# Patient Record
Sex: Female | Born: 1950
Health system: Southern US, Community
[De-identification: ages and names within clinical notes are randomized; demographics above are authoritative.]

## PROBLEM LIST (undated history)

## (undated) DIAGNOSIS — K219 Gastro-esophageal reflux disease without esophagitis: Secondary | ICD-10-CM

## (undated) DIAGNOSIS — F329 Major depressive disorder, single episode, unspecified: Secondary | ICD-10-CM

## (undated) DIAGNOSIS — D219 Benign neoplasm of connective and other soft tissue, unspecified: Secondary | ICD-10-CM

## (undated) DIAGNOSIS — I712 Thoracic aortic aneurysm, without rupture, unspecified: Secondary | ICD-10-CM

## (undated) DIAGNOSIS — Z8719 Personal history of other diseases of the digestive system: Secondary | ICD-10-CM

## (undated) DIAGNOSIS — K648 Other hemorrhoids: Secondary | ICD-10-CM

## (undated) DIAGNOSIS — D649 Anemia, unspecified: Secondary | ICD-10-CM

## (undated) DIAGNOSIS — S42309A Unspecified fracture of shaft of humerus, unspecified arm, initial encounter for closed fracture: Secondary | ICD-10-CM

## (undated) DIAGNOSIS — M797 Fibromyalgia: Secondary | ICD-10-CM

## (undated) DIAGNOSIS — E039 Hypothyroidism, unspecified: Secondary | ICD-10-CM

## (undated) DIAGNOSIS — J309 Allergic rhinitis, unspecified: Secondary | ICD-10-CM

## (undated) DIAGNOSIS — E785 Hyperlipidemia, unspecified: Secondary | ICD-10-CM

## (undated) DIAGNOSIS — N6039 Fibrosclerosis of unspecified breast: Secondary | ICD-10-CM

## (undated) DIAGNOSIS — M8580 Other specified disorders of bone density and structure, unspecified site: Secondary | ICD-10-CM

## (undated) DIAGNOSIS — E790 Hyperuricemia without signs of inflammatory arthritis and tophaceous disease: Secondary | ICD-10-CM

## (undated) DIAGNOSIS — R569 Unspecified convulsions: Secondary | ICD-10-CM

## (undated) DIAGNOSIS — G40409 Other generalized epilepsy and epileptic syndromes, not intractable, without status epilepticus: Secondary | ICD-10-CM

## (undated) DIAGNOSIS — Z87442 Personal history of urinary calculi: Secondary | ICD-10-CM

## (undated) DIAGNOSIS — Z8639 Personal history of other endocrine, nutritional and metabolic disease: Secondary | ICD-10-CM

## (undated) DIAGNOSIS — R7302 Impaired glucose tolerance (oral): Secondary | ICD-10-CM

## (undated) DIAGNOSIS — M1990 Unspecified osteoarthritis, unspecified site: Secondary | ICD-10-CM

## (undated) DIAGNOSIS — N390 Urinary tract infection, site not specified: Secondary | ICD-10-CM

## (undated) HISTORY — DX: Benign neoplasm of connective and other soft tissue, unspecified: D21.9

## (undated) HISTORY — DX: Unspecified fracture of shaft of humerus, unspecified arm, initial encounter for closed fracture: S42.309A

## (undated) HISTORY — DX: Other generalized epilepsy and epileptic syndromes, not intractable, without status epilepticus: G40.409

## (undated) HISTORY — DX: Allergic rhinitis, unspecified: J30.9

## (undated) HISTORY — DX: Major depressive disorder, single episode, unspecified: F32.9

## (undated) HISTORY — DX: Fibromyalgia: M79.7

## (undated) HISTORY — DX: Unspecified convulsions: R56.9

## (undated) HISTORY — DX: Hyperlipidemia, unspecified: E78.5

## (undated) HISTORY — DX: Personal history of other endocrine, nutritional and metabolic disease: Z86.39

## (undated) HISTORY — DX: Hyperuricemia without signs of inflammatory arthritis and tophaceous disease: E79.0

## (undated) HISTORY — DX: Fibrosclerosis of unspecified breast: N60.39

## (undated) HISTORY — DX: Urinary tract infection, site not specified: N39.0

## (undated) HISTORY — DX: Hypothyroidism, unspecified: E03.9

## (undated) HISTORY — PX: CYSTOSCOPY: SUR368

## (undated) HISTORY — DX: Impaired glucose tolerance (oral): R73.02

## (undated) HISTORY — DX: Other specified disorders of bone density and structure, unspecified site: M85.80

## (undated) HISTORY — DX: Other hemorrhoids: K64.8

## (undated) HISTORY — PX: URETEROSCOPY: SHX842

## (undated) HISTORY — DX: Anemia, unspecified: D64.9

## (undated) HISTORY — PX: OTHER SURGICAL HISTORY: SHX169

---
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
## 1985-01-04 HISTORY — PX: OTHER SURGICAL HISTORY: SHX169

## 1987-01-05 HISTORY — PX: ABDOMINAL HYSTERECTOMY: SHX81

## 1988-01-05 DIAGNOSIS — E039 Hypothyroidism, unspecified: Secondary | ICD-10-CM

## 1988-01-05 DIAGNOSIS — M797 Fibromyalgia: Secondary | ICD-10-CM

## 1988-01-05 HISTORY — DX: Fibromyalgia: M79.7

## 1988-01-05 HISTORY — DX: Hypothyroidism, unspecified: E03.9

## 1989-01-04 DIAGNOSIS — F32A Depression, unspecified: Secondary | ICD-10-CM

## 1989-01-04 HISTORY — DX: Depression, unspecified: F32.A

## 2005-10-12 DIAGNOSIS — K648 Other hemorrhoids: Secondary | ICD-10-CM

## 2005-10-12 HISTORY — DX: Other hemorrhoids: K64.8

## 2010-01-04 DIAGNOSIS — M8580 Other specified disorders of bone density and structure, unspecified site: Secondary | ICD-10-CM

## 2010-01-04 HISTORY — DX: Other specified disorders of bone density and structure, unspecified site: M85.80

## 2011-01-18 DIAGNOSIS — N390 Urinary tract infection, site not specified: Secondary | ICD-10-CM | POA: Diagnosis not present

## 2011-03-16 DIAGNOSIS — D1801 Hemangioma of skin and subcutaneous tissue: Secondary | ICD-10-CM | POA: Diagnosis not present

## 2011-08-24 DIAGNOSIS — G9332 Myalgic encephalomyelitis/chronic fatigue syndrome: Secondary | ICD-10-CM | POA: Diagnosis not present

## 2011-08-24 DIAGNOSIS — E039 Hypothyroidism, unspecified: Secondary | ICD-10-CM | POA: Diagnosis not present

## 2011-08-24 DIAGNOSIS — N390 Urinary tract infection, site not specified: Secondary | ICD-10-CM | POA: Diagnosis not present

## 2011-08-24 DIAGNOSIS — R5382 Chronic fatigue, unspecified: Secondary | ICD-10-CM | POA: Diagnosis not present

## 2011-09-07 DIAGNOSIS — Z1231 Encounter for screening mammogram for malignant neoplasm of breast: Secondary | ICD-10-CM | POA: Diagnosis not present

## 2011-11-24 DIAGNOSIS — R7309 Other abnormal glucose: Secondary | ICD-10-CM | POA: Diagnosis not present

## 2011-11-24 DIAGNOSIS — E039 Hypothyroidism, unspecified: Secondary | ICD-10-CM | POA: Diagnosis not present

## 2011-11-24 DIAGNOSIS — R7989 Other specified abnormal findings of blood chemistry: Secondary | ICD-10-CM | POA: Diagnosis not present

## 2011-11-24 DIAGNOSIS — E785 Hyperlipidemia, unspecified: Secondary | ICD-10-CM | POA: Diagnosis not present

## 2011-11-24 DIAGNOSIS — R5382 Chronic fatigue, unspecified: Secondary | ICD-10-CM | POA: Diagnosis not present

## 2011-11-24 DIAGNOSIS — N390 Urinary tract infection, site not specified: Secondary | ICD-10-CM | POA: Diagnosis not present

## 2011-12-10 DIAGNOSIS — E039 Hypothyroidism, unspecified: Secondary | ICD-10-CM | POA: Diagnosis not present

## 2011-12-27 DIAGNOSIS — H43399 Other vitreous opacities, unspecified eye: Secondary | ICD-10-CM | POA: Diagnosis not present

## 2011-12-27 DIAGNOSIS — H2589 Other age-related cataract: Secondary | ICD-10-CM | POA: Diagnosis not present

## 2011-12-27 DIAGNOSIS — H40019 Open angle with borderline findings, low risk, unspecified eye: Secondary | ICD-10-CM | POA: Diagnosis not present

## 2012-03-21 DIAGNOSIS — N898 Other specified noninflammatory disorders of vagina: Secondary | ICD-10-CM | POA: Diagnosis not present

## 2012-03-21 DIAGNOSIS — R300 Dysuria: Secondary | ICD-10-CM | POA: Diagnosis not present

## 2012-08-03 DIAGNOSIS — J069 Acute upper respiratory infection, unspecified: Secondary | ICD-10-CM | POA: Diagnosis not present

## 2012-08-03 DIAGNOSIS — R5383 Other fatigue: Secondary | ICD-10-CM | POA: Diagnosis not present

## 2012-08-03 DIAGNOSIS — L578 Other skin changes due to chronic exposure to nonionizing radiation: Secondary | ICD-10-CM | POA: Diagnosis not present

## 2012-08-03 DIAGNOSIS — R5381 Other malaise: Secondary | ICD-10-CM | POA: Diagnosis not present

## 2012-08-03 DIAGNOSIS — N390 Urinary tract infection, site not specified: Secondary | ICD-10-CM | POA: Diagnosis not present

## 2012-08-21 DIAGNOSIS — R5382 Chronic fatigue, unspecified: Secondary | ICD-10-CM | POA: Diagnosis not present

## 2012-08-21 DIAGNOSIS — R7989 Other specified abnormal findings of blood chemistry: Secondary | ICD-10-CM | POA: Diagnosis not present

## 2012-08-21 DIAGNOSIS — E039 Hypothyroidism, unspecified: Secondary | ICD-10-CM | POA: Diagnosis not present

## 2012-09-05 DIAGNOSIS — J309 Allergic rhinitis, unspecified: Secondary | ICD-10-CM | POA: Diagnosis not present

## 2012-09-05 DIAGNOSIS — Z1331 Encounter for screening for depression: Secondary | ICD-10-CM | POA: Diagnosis not present

## 2012-09-05 DIAGNOSIS — M899 Disorder of bone, unspecified: Secondary | ICD-10-CM | POA: Diagnosis not present

## 2012-09-05 DIAGNOSIS — E039 Hypothyroidism, unspecified: Secondary | ICD-10-CM | POA: Diagnosis not present

## 2012-09-05 DIAGNOSIS — R5382 Chronic fatigue, unspecified: Secondary | ICD-10-CM | POA: Diagnosis not present

## 2012-09-05 DIAGNOSIS — IMO0002 Reserved for concepts with insufficient information to code with codable children: Secondary | ICD-10-CM | POA: Diagnosis not present

## 2012-09-05 DIAGNOSIS — R569 Unspecified convulsions: Secondary | ICD-10-CM | POA: Diagnosis not present

## 2012-11-15 DIAGNOSIS — R6882 Decreased libido: Secondary | ICD-10-CM | POA: Diagnosis not present

## 2012-12-13 DIAGNOSIS — Z1231 Encounter for screening mammogram for malignant neoplasm of breast: Secondary | ICD-10-CM | POA: Diagnosis not present

## 2012-12-20 DIAGNOSIS — H2589 Other age-related cataract: Secondary | ICD-10-CM | POA: Diagnosis not present

## 2012-12-20 DIAGNOSIS — H40009 Preglaucoma, unspecified, unspecified eye: Secondary | ICD-10-CM | POA: Diagnosis not present

## 2013-02-07 DIAGNOSIS — R6882 Decreased libido: Secondary | ICD-10-CM | POA: Diagnosis not present

## 2013-04-12 DIAGNOSIS — J069 Acute upper respiratory infection, unspecified: Secondary | ICD-10-CM | POA: Diagnosis not present

## 2013-04-12 DIAGNOSIS — R6882 Decreased libido: Secondary | ICD-10-CM | POA: Diagnosis not present

## 2013-06-20 DIAGNOSIS — E785 Hyperlipidemia, unspecified: Secondary | ICD-10-CM | POA: Diagnosis not present

## 2013-06-20 DIAGNOSIS — M949 Disorder of cartilage, unspecified: Secondary | ICD-10-CM | POA: Diagnosis not present

## 2013-06-20 DIAGNOSIS — E039 Hypothyroidism, unspecified: Secondary | ICD-10-CM | POA: Diagnosis not present

## 2013-06-20 DIAGNOSIS — M899 Disorder of bone, unspecified: Secondary | ICD-10-CM | POA: Diagnosis not present

## 2013-06-20 DIAGNOSIS — R82998 Other abnormal findings in urine: Secondary | ICD-10-CM | POA: Diagnosis not present

## 2013-06-20 DIAGNOSIS — R809 Proteinuria, unspecified: Secondary | ICD-10-CM | POA: Diagnosis not present

## 2013-06-26 DIAGNOSIS — Z1212 Encounter for screening for malignant neoplasm of rectum: Secondary | ICD-10-CM | POA: Diagnosis not present

## 2013-06-27 DIAGNOSIS — E785 Hyperlipidemia, unspecified: Secondary | ICD-10-CM | POA: Diagnosis not present

## 2013-06-27 DIAGNOSIS — R002 Palpitations: Secondary | ICD-10-CM | POA: Diagnosis not present

## 2013-06-27 DIAGNOSIS — Z0000 Encounter for general adult medical examination without abnormal findings: Secondary | ICD-10-CM | POA: Diagnosis not present

## 2013-06-27 DIAGNOSIS — R5382 Chronic fatigue, unspecified: Secondary | ICD-10-CM | POA: Diagnosis not present

## 2013-06-27 DIAGNOSIS — R7989 Other specified abnormal findings of blood chemistry: Secondary | ICD-10-CM | POA: Diagnosis not present

## 2013-06-27 DIAGNOSIS — G9332 Myalgic encephalomyelitis/chronic fatigue syndrome: Secondary | ICD-10-CM | POA: Diagnosis not present

## 2013-06-27 DIAGNOSIS — E039 Hypothyroidism, unspecified: Secondary | ICD-10-CM | POA: Diagnosis not present

## 2013-06-27 DIAGNOSIS — L259 Unspecified contact dermatitis, unspecified cause: Secondary | ICD-10-CM | POA: Diagnosis not present

## 2013-06-27 DIAGNOSIS — M899 Disorder of bone, unspecified: Secondary | ICD-10-CM | POA: Diagnosis not present

## 2013-06-27 DIAGNOSIS — R569 Unspecified convulsions: Secondary | ICD-10-CM | POA: Diagnosis not present

## 2013-06-27 DIAGNOSIS — M949 Disorder of cartilage, unspecified: Secondary | ICD-10-CM | POA: Diagnosis not present

## 2013-08-31 DIAGNOSIS — Z6826 Body mass index (BMI) 26.0-26.9, adult: Secondary | ICD-10-CM | POA: Diagnosis not present

## 2013-08-31 DIAGNOSIS — R197 Diarrhea, unspecified: Secondary | ICD-10-CM | POA: Diagnosis not present

## 2013-08-31 DIAGNOSIS — E039 Hypothyroidism, unspecified: Secondary | ICD-10-CM | POA: Diagnosis not present

## 2013-08-31 DIAGNOSIS — G9332 Myalgic encephalomyelitis/chronic fatigue syndrome: Secondary | ICD-10-CM | POA: Diagnosis not present

## 2013-08-31 DIAGNOSIS — R110 Nausea: Secondary | ICD-10-CM | POA: Diagnosis not present

## 2013-08-31 DIAGNOSIS — R142 Eructation: Secondary | ICD-10-CM | POA: Diagnosis not present

## 2013-08-31 DIAGNOSIS — R141 Gas pain: Secondary | ICD-10-CM | POA: Diagnosis not present

## 2013-08-31 DIAGNOSIS — R143 Flatulence: Secondary | ICD-10-CM | POA: Diagnosis not present

## 2013-08-31 DIAGNOSIS — R5382 Chronic fatigue, unspecified: Secondary | ICD-10-CM | POA: Diagnosis not present

## 2013-09-05 DIAGNOSIS — R928 Other abnormal and inconclusive findings on diagnostic imaging of breast: Secondary | ICD-10-CM | POA: Diagnosis not present

## 2013-09-05 DIAGNOSIS — Q638 Other specified congenital malformations of kidney: Secondary | ICD-10-CM | POA: Diagnosis not present

## 2013-09-05 DIAGNOSIS — K7689 Other specified diseases of liver: Secondary | ICD-10-CM | POA: Diagnosis not present

## 2013-09-11 ENCOUNTER — Encounter: Payer: Self-pay | Admitting: Nurse Practitioner

## 2013-09-19 ENCOUNTER — Ambulatory Visit: Payer: Self-pay | Admitting: Nurse Practitioner

## 2013-09-19 ENCOUNTER — Encounter: Payer: Self-pay | Admitting: *Deleted

## 2013-09-20 ENCOUNTER — Ambulatory Visit (INDEPENDENT_AMBULATORY_CARE_PROVIDER_SITE_OTHER): Payer: Medicare Other | Admitting: Nurse Practitioner

## 2013-09-20 ENCOUNTER — Other Ambulatory Visit (INDEPENDENT_AMBULATORY_CARE_PROVIDER_SITE_OTHER): Payer: Medicare Other

## 2013-09-20 ENCOUNTER — Encounter: Payer: Self-pay | Admitting: Nurse Practitioner

## 2013-09-20 VITALS — BP 128/82 | HR 72 | Ht 64.5 in | Wt 154.0 lb

## 2013-09-20 DIAGNOSIS — R140 Abdominal distension (gaseous): Secondary | ICD-10-CM

## 2013-09-20 DIAGNOSIS — R142 Eructation: Secondary | ICD-10-CM

## 2013-09-20 DIAGNOSIS — R141 Gas pain: Secondary | ICD-10-CM

## 2013-09-20 DIAGNOSIS — R143 Flatulence: Secondary | ICD-10-CM

## 2013-09-20 DIAGNOSIS — R197 Diarrhea, unspecified: Secondary | ICD-10-CM | POA: Diagnosis not present

## 2013-09-20 LAB — IGA: IgA: 210 mg/dL (ref 68–378)

## 2013-09-20 MED ORDER — RIFAXIMIN 550 MG PO TABS: ORAL_TABLET | ORAL | Status: DC

## 2013-09-20 NOTE — Patient Instructions (Signed)
Please go to the basement level to have your labs drawn.  We called in the Flagyl ( Metronidazole) 250 mg to [facility]   We made you a follow up appointment with Dr. Fuller Plan, for 10-17-2013 at 11 AM.

## 2013-09-20 NOTE — Progress Notes (Addendum)
HPI :  Patient is a 63 year old female, new to this practice, referred by PCP at [facility] for evaluation of nausea, bloating and frequent diarrhea. Her problems began about four months ago. She was tried on IT trainer with limited benefit. She is using Phazyme for gas. CTscan earlier this month revealed mild fecal retention which was surprising to her. Patient hasn't changed any of her home meds. No dietary changes. June labs (I reviewed her online labs on her cell phone)  revealed normal hgb of 14.9, normal LFTs, normal TSH. Uric acid and serum calcium elevated. Her last colonoscopy was in 2007. She was heme negative in June. Weight is up a few pounds.   Patient has been has fibromyalgia and has been on disability since the 1990s for chronic fatigue syndrome. She is under care of Dr. Jeral Fruit in [place]. Patient mentions that GI problems often accompany chronic fatigue syndrome though she never had GI problems until a few months ago.   Past Medical History  Diagnosis Date  . Hemorrhoids, internal [id number]  . Grand mal seizure 1960  . Arm fracture 1957  . Osteopenia 2012    Bone density Test  . Allergic rhinitis     Dust mites, mold, plant polens  . Fibrosis, breast     Mammogram  . Hypothyroidism 1990  . Hyperlipidemia   . Glucose intolerance (impaired glucose tolerance)   . History of hypercalcemia   . Elevated uric acid in blood   . Anemia 1970's  . Depression 1991    6 months  . Fibromyalgia 1990  . Seizures   . Urinary tract infection     Family History  Problem Relation Age of Onset  . Parkinson's disease Father     Deceased at 57  . Hypertension Mother   . Osteoporosis Mother   . Breast cancer Sister   . Arthritis Brother   . Kidney Stones Brother   . Gout Brother   . Pancreatic cancer Paternal Aunt   . Colon cancer Neg Hx   . Colon polyps Neg Hx    History  Substance Use Topics  . Smoking status: Never Smoker   . Smokeless tobacco:  Never Used  . Alcohol Use: No   Current Outpatient Prescriptions  Medication Sig Dispense Refill  . amphetamine-dextroamphetamine (ADDERALL) 20 MG tablet Take 20 mg by mouth daily. 10 mg in morning and 10 mg at lunchtime      . clonazePAM (KLONOPIN) 1 MG tablet Take 1 mg by mouth daily. .75 mg at pm; .25 mg in am      . estradiol (ESTRACE) 0.5 MG tablet Take 0.5 mg by mouth daily.      Marland Kitchen levothyroxine (SYNTHROID, LEVOTHROID) 100 MCG tablet Take 100 mcg by mouth daily before breakfast. Take 1 tab daily for 6 days and no tab on Sundays.       No current facility-administered medications for this visit.   Allergies  Allergen Reactions  . Ciprofloxacin     Pt gets dehydrated  . Epinephrine     Makes pt feel like they are coming out of their skin     Review of Systems: Positive for fatigue, muscle pain, shortness of breath and swollen lymph glands. All other systems reviewed and negative except where noted in HPI.   Physical Exam: BP 128/82  Pulse 72  Ht 5' 4.5" (1.638 m)  Wt 154 lb (69.854 kg)  BMI 26.04 kg/m2 Constitutional: Tired looking white female  in no acute distress. HEENT: Normocephalic and atraumatic. Conjunctivae are normal. No scleral icterus. Neck supple.  Cardiovascular: Normal rate, regular rhythm.  Pulmonary/chest: Effort normal and breath sounds normal. No wheezing, rales or rhonchi. Abdominal: Soft, nondistended, nontender. Bowel sounds active throughout. There are no masses palpable. No hepatomegaly. Extremities: no edema Lymphadenopathy: No cervical adenopathy noted. Neurological: Alert and oriented to person place and time. Skin: Skin is warm and dry. No rashes noted. Psychiatric: slightly agitated. Flat affect.   ASSESSMENT AND PLAN: 37. 63 year old female with a several month history of bloating, excessive belching, occasional loose stools and generalized abdominal pain not necessarily related to eating. Adderall can cause loose stools but she has been on  this medication for a long time. Symptoms sound functional but will obtain labs for celiac disease. Trial of Flagyl for possible small bowel bacterial overgrowth. If no improvement consider workup for biliary dyskinesia. Stop PPI, it hasn't been beneficial and she has to pay out of pocket for all her medications.   2. Chronic fatigue syndrome and fibromyalgia. She has been on disability since the 1990's for chronic fatigue syndrome. She is treated with Adderall and followed by Dr. Jeral Fruit in [facility]  Addendum 10/01/13 Colonoscopy report received from [facility]. Colonoscopy was done for screening purposes. Extent of exam to the cecum. Prep was adequate. Findings included small internal hemorrhoids. Recommended repeat colonoscopy in 10 years.

## 2013-09-21 ENCOUNTER — Encounter: Payer: Self-pay | Admitting: Nurse Practitioner

## 2013-09-21 DIAGNOSIS — R140 Abdominal distension (gaseous): Secondary | ICD-10-CM | POA: Insufficient documentation

## 2013-09-21 DIAGNOSIS — R197 Diarrhea, unspecified: Secondary | ICD-10-CM | POA: Insufficient documentation

## 2013-09-23 ENCOUNTER — Encounter: Payer: Self-pay | Admitting: Nurse Practitioner

## 2013-09-23 LAB — T-TRANSGLUTAMINASE (TTG) IGG: Tissue Transglut Ab: 3 U/mL (ref 0–5)

## 2013-09-23 NOTE — Progress Notes (Signed)
Reviewed and agree with management plan.  Tannen Vandezande T. Judith Demps, MD FACG 

## 2013-09-27 ENCOUNTER — Telehealth: Payer: Self-pay | Admitting: *Deleted

## 2013-09-27 NOTE — Telephone Encounter (Signed)
Message copied by Hulan Saas on Thu Sep 27, 2013  3:45 PM ------      Message from: Willia Craze      Created: Thu Sep 27, 2013 11:47 AM       Rollene Fare, patient had sent an email asking if flagyl may have caused a sore throat. I accidentally closed out her email without answering it. Please tell her it is probably unrelated unless she has gotten yeast infection from antibiotics (ask her to look at for white film on tongue). Glad flagyl is helping her GI symptoms      Thanks ------

## 2013-09-27 NOTE — Telephone Encounter (Signed)
Spoke with patient and she is better now. She had a cold.

## 2013-10-17 ENCOUNTER — Encounter: Payer: Self-pay | Admitting: Gastroenterology

## 2013-10-17 ENCOUNTER — Ambulatory Visit (INDEPENDENT_AMBULATORY_CARE_PROVIDER_SITE_OTHER): Payer: Medicare Other | Admitting: Gastroenterology

## 2013-10-17 VITALS — BP 136/82 | HR 76 | Ht 64.5 in | Wt 153.0 lb

## 2013-10-17 DIAGNOSIS — R1084 Generalized abdominal pain: Secondary | ICD-10-CM

## 2013-10-17 DIAGNOSIS — R140 Abdominal distension (gaseous): Secondary | ICD-10-CM | POA: Diagnosis not present

## 2013-10-17 DIAGNOSIS — K219 Gastro-esophageal reflux disease without esophagitis: Secondary | ICD-10-CM | POA: Diagnosis not present

## 2013-10-17 DIAGNOSIS — R197 Diarrhea, unspecified: Secondary | ICD-10-CM

## 2013-10-17 MED ORDER — METRONIDAZOLE 500 MG PO TABS: 500.0000 mg | ORAL_TABLET | Freq: Two times a day (BID) | ORAL | Status: DC

## 2013-10-17 NOTE — Progress Notes (Signed)
    History of Present Illness: This is a 63 year old female with chronic fatigue syndrome who has multiple gastrointestinal complaints. She was evaluated by Jean Scott in mid-September. She notes postprandial bloating and generalized abdominal discomfort following meals for about the past 6 months. She relates reflux symptoms and regurgitation, frequently. These symptoms were partially controlled on omeprazole and have worsened since she discontinued omeprazole. She has loose bowel movements frequently and her symptoms worsen when she eats out. She notes the symptoms improved while taking a course of metronidazole however her symptoms have returned to her baseline after finishing metronidazole. She underwent a CT scan of the abdomen/pelvis on 09/05/2013 showing a slight increase in fecal burden, fatty liver and a right breast cyst. She underwent colonoscopy in 2007 at outside facility that was apparently normal. A celiac disease antibody testing was negative. She notes a slight weight gain over the past several months. Denies weight loss, constipation, change in stool caliber, melena, hematochezia, nausea, vomiting, dysphagia, chest pain.  Current Medications, Allergies, Past Medical History, Past Surgical History, Family History and Social History were reviewed in [employer] record.  Physical Exam: General: Well developed , well nourished, no acute distress Head: Normocephalic and atraumatic Eyes:  sclerae anicteric, EOMI Ears: Normal auditory acuity Mouth: No deformity or lesions Lungs: Clear throughout to auscultation Heart: Regular rate and rhythm; no murmurs, rubs or bruits Abdomen: Soft, non tender and non distended. No masses, hepatosplenomegaly or hernias noted. Normal Bowel sounds Musculoskeletal: Symmetrical with no gross deformities  Pulses:  Normal pulses noted Extremities: No clubbing, cyanosis, edema or deformities noted Neurological: Alert oriented x 4,  grossly nonfocal Psychological:  Alert and cooperative. Normal mood and affect  Assessment and Recommendations:  1. GERD, diarrhea, belching, postprandial abdominal bloating and abdominal pain. Suspect that she has GERD and IBS. She states she does not have prescription coverage and she's very concerned about the cost of medications which will limit our options. Omeprazole 20 mg twice daily. Metronidazole 500 mg twice daily for 14 days for treatment of possible SIBO and possible IBS-D. Begin a daily probiotic. All standard antireflux measures. 7 day trial of a lactose free diet. Begin a low gas diet and Gas-X 4 times a day as needed. If symptoms persist will obtain a GI pathogen panel, stool Hemoccults, consider FODMAP diet, consider an anti-spasmodic, consider GES and consider colonoscopy and upper endoscopy. Return office visit in one month.

## 2013-10-17 NOTE — Patient Instructions (Addendum)
You have been given a low gas diet, and anti reflux measures to follow. Please remain lactose free for 1 week. Try Gas-X four times daily as needed. Align once daily Omeprazole 20 mg over the counter twice daily. We have sent medications to your pharmacy for you to pick up at your convenience. Follow up on 11/20/13 at 9 am with Dr Fuller Plan.  CC: Prince Solian, MD

## 2013-10-24 ENCOUNTER — Encounter: Payer: Self-pay | Admitting: Gastroenterology

## 2013-11-20 ENCOUNTER — Encounter: Payer: Self-pay | Admitting: Gastroenterology

## 2013-11-20 ENCOUNTER — Ambulatory Visit (INDEPENDENT_AMBULATORY_CARE_PROVIDER_SITE_OTHER): Payer: Medicare Other | Admitting: Gastroenterology

## 2013-11-20 VITALS — BP 130/90 | HR 68 | Ht 64.5 in | Wt 152.8 lb

## 2013-11-20 DIAGNOSIS — K219 Gastro-esophageal reflux disease without esophagitis: Secondary | ICD-10-CM

## 2013-11-20 DIAGNOSIS — E739 Lactose intolerance, unspecified: Secondary | ICD-10-CM | POA: Insufficient documentation

## 2013-11-20 NOTE — Progress Notes (Signed)
    History of Present Illness: This is a 63 year old female returning for follow-up of GERD, gas, bloating, belching, abdominal pain and diarrhea. Her symptoms all resolved after taking a course of metronidazole for SIBO or ISB-D, following a reduced lactose diet and intensify her treatment for GERD. She has no gastrointestinal complaints today.  Current Medications, Allergies, Past Medical History, Past Surgical History, Family History and Social History were reviewed in [employer] record.  Physical Exam: General: Well developed , well nourished, no acute distress Head: Normocephalic and atraumatic Eyes:  sclerae anicteric, EOMI Ears: Normal auditory acuity Mouth: No deformity or lesions Lungs: Clear throughout to auscultation Heart: Regular rate and rhythm; no murmurs, rubs or bruits Abdomen: Soft, non tender and non distended. No masses, hepatosplenomegaly or hernias noted. Normal Bowel sounds Musculoskeletal: Symmetrical with no gross deformities  Pulses:  Normal pulses noted Extremities: No clubbing, cyanosis, edema or deformities noted Neurological: Alert oriented x 4, grossly nonfocal Psychological:  Alert and cooperative. Normal mood and affect  Assessment and Recommendations:  GERD, SIBO, lactose intolerance and possible IBS. Symptoms resolved after completing a course of metronidazole,following a reduced lactose diet and increasing PPI dosage. Continue omeprazole 20 mg twice daily and a daily probiotic. Continue all standard antireflux measures and a reduced lactose diet. Continue low gas diet and Gas-X 4 times a day as needed. Follow up as needed.

## 2013-11-20 NOTE — Patient Instructions (Signed)
Please follow up with Dr Fuller Plan as needed.  CC: Prince Solian, MD

## 2014-01-01 DIAGNOSIS — H40023 Open angle with borderline findings, high risk, bilateral: Secondary | ICD-10-CM | POA: Diagnosis not present

## 2014-01-01 DIAGNOSIS — H2513 Age-related nuclear cataract, bilateral: Secondary | ICD-10-CM | POA: Diagnosis not present

## 2014-01-09 DIAGNOSIS — Z01419 Encounter for gynecological examination (general) (routine) without abnormal findings: Secondary | ICD-10-CM | POA: Diagnosis not present

## 2014-01-09 DIAGNOSIS — Z1231 Encounter for screening mammogram for malignant neoplasm of breast: Secondary | ICD-10-CM | POA: Diagnosis not present

## 2014-03-05 DIAGNOSIS — H18411 Arcus senilis, right eye: Secondary | ICD-10-CM | POA: Diagnosis not present

## 2014-03-05 DIAGNOSIS — H18412 Arcus senilis, left eye: Secondary | ICD-10-CM | POA: Diagnosis not present

## 2014-03-05 DIAGNOSIS — H2511 Age-related nuclear cataract, right eye: Secondary | ICD-10-CM | POA: Diagnosis not present

## 2014-03-05 DIAGNOSIS — H02839 Dermatochalasis of unspecified eye, unspecified eyelid: Secondary | ICD-10-CM | POA: Diagnosis not present

## 2014-03-22 DIAGNOSIS — R8299 Other abnormal findings in urine: Secondary | ICD-10-CM | POA: Diagnosis not present

## 2014-03-22 DIAGNOSIS — N390 Urinary tract infection, site not specified: Secondary | ICD-10-CM | POA: Diagnosis not present

## 2014-07-03 DIAGNOSIS — N390 Urinary tract infection, site not specified: Secondary | ICD-10-CM | POA: Diagnosis not present

## 2014-07-03 DIAGNOSIS — R7301 Impaired fasting glucose: Secondary | ICD-10-CM | POA: Diagnosis not present

## 2014-07-03 DIAGNOSIS — R8299 Other abnormal findings in urine: Secondary | ICD-10-CM | POA: Diagnosis not present

## 2014-07-03 DIAGNOSIS — E039 Hypothyroidism, unspecified: Secondary | ICD-10-CM | POA: Diagnosis not present

## 2014-07-03 DIAGNOSIS — M859 Disorder of bone density and structure, unspecified: Secondary | ICD-10-CM | POA: Diagnosis not present

## 2014-07-03 DIAGNOSIS — E785 Hyperlipidemia, unspecified: Secondary | ICD-10-CM | POA: Diagnosis not present

## 2014-07-10 DIAGNOSIS — R300 Dysuria: Secondary | ICD-10-CM | POA: Diagnosis not present

## 2014-07-10 DIAGNOSIS — J309 Allergic rhinitis, unspecified: Secondary | ICD-10-CM | POA: Diagnosis not present

## 2014-07-10 DIAGNOSIS — Z6826 Body mass index (BMI) 26.0-26.9, adult: Secondary | ICD-10-CM | POA: Diagnosis not present

## 2014-07-10 DIAGNOSIS — R002 Palpitations: Secondary | ICD-10-CM | POA: Diagnosis not present

## 2014-07-10 DIAGNOSIS — Z23 Encounter for immunization: Secondary | ICD-10-CM | POA: Diagnosis not present

## 2014-07-10 DIAGNOSIS — Z1389 Encounter for screening for other disorder: Secondary | ICD-10-CM | POA: Diagnosis not present

## 2014-07-10 DIAGNOSIS — E785 Hyperlipidemia, unspecified: Secondary | ICD-10-CM | POA: Diagnosis not present

## 2014-07-10 DIAGNOSIS — E790 Hyperuricemia without signs of inflammatory arthritis and tophaceous disease: Secondary | ICD-10-CM | POA: Diagnosis not present

## 2014-07-10 DIAGNOSIS — E039 Hypothyroidism, unspecified: Secondary | ICD-10-CM | POA: Diagnosis not present

## 2014-07-10 DIAGNOSIS — Z1212 Encounter for screening for malignant neoplasm of rectum: Secondary | ICD-10-CM | POA: Diagnosis not present

## 2014-07-10 DIAGNOSIS — L309 Dermatitis, unspecified: Secondary | ICD-10-CM | POA: Diagnosis not present

## 2014-07-10 DIAGNOSIS — Z0000 Encounter for general adult medical examination without abnormal findings: Secondary | ICD-10-CM | POA: Diagnosis not present

## 2014-07-30 DIAGNOSIS — L570 Actinic keratosis: Secondary | ICD-10-CM | POA: Diagnosis not present

## 2014-07-30 DIAGNOSIS — L718 Other rosacea: Secondary | ICD-10-CM | POA: Diagnosis not present

## 2014-07-30 DIAGNOSIS — L738 Other specified follicular disorders: Secondary | ICD-10-CM | POA: Diagnosis not present

## 2014-07-30 DIAGNOSIS — L814 Other melanin hyperpigmentation: Secondary | ICD-10-CM | POA: Diagnosis not present

## 2014-08-13 DIAGNOSIS — H40023 Open angle with borderline findings, high risk, bilateral: Secondary | ICD-10-CM | POA: Diagnosis not present

## 2014-08-13 DIAGNOSIS — H2513 Age-related nuclear cataract, bilateral: Secondary | ICD-10-CM | POA: Diagnosis not present

## 2014-09-03 DIAGNOSIS — H2513 Age-related nuclear cataract, bilateral: Secondary | ICD-10-CM | POA: Diagnosis not present

## 2014-09-03 DIAGNOSIS — H18413 Arcus senilis, bilateral: Secondary | ICD-10-CM | POA: Diagnosis not present

## 2014-09-03 DIAGNOSIS — H02839 Dermatochalasis of unspecified eye, unspecified eyelid: Secondary | ICD-10-CM | POA: Diagnosis not present

## 2014-09-25 DIAGNOSIS — H04123 Dry eye syndrome of bilateral lacrimal glands: Secondary | ICD-10-CM | POA: Diagnosis not present

## 2014-10-24 DIAGNOSIS — H25812 Combined forms of age-related cataract, left eye: Secondary | ICD-10-CM | POA: Diagnosis not present

## 2014-10-24 DIAGNOSIS — H25813 Combined forms of age-related cataract, bilateral: Secondary | ICD-10-CM | POA: Diagnosis not present

## 2014-10-24 DIAGNOSIS — E039 Hypothyroidism, unspecified: Secondary | ICD-10-CM | POA: Diagnosis not present

## 2014-10-24 DIAGNOSIS — H25811 Combined forms of age-related cataract, right eye: Secondary | ICD-10-CM | POA: Diagnosis not present

## 2014-10-24 DIAGNOSIS — E785 Hyperlipidemia, unspecified: Secondary | ICD-10-CM | POA: Diagnosis not present

## 2014-10-24 DIAGNOSIS — Z79899 Other long term (current) drug therapy: Secondary | ICD-10-CM | POA: Diagnosis not present

## 2014-10-24 DIAGNOSIS — M797 Fibromyalgia: Secondary | ICD-10-CM | POA: Diagnosis not present

## 2014-10-31 DIAGNOSIS — H25811 Combined forms of age-related cataract, right eye: Secondary | ICD-10-CM | POA: Diagnosis not present

## 2014-11-12 DIAGNOSIS — Z6826 Body mass index (BMI) 26.0-26.9, adult: Secondary | ICD-10-CM | POA: Diagnosis not present

## 2014-11-12 DIAGNOSIS — R310 Gross hematuria: Secondary | ICD-10-CM | POA: Diagnosis not present

## 2014-11-12 DIAGNOSIS — R8290 Unspecified abnormal findings in urine: Secondary | ICD-10-CM | POA: Diagnosis not present

## 2014-11-12 DIAGNOSIS — R109 Unspecified abdominal pain: Secondary | ICD-10-CM | POA: Diagnosis not present

## 2014-11-12 DIAGNOSIS — N390 Urinary tract infection, site not specified: Secondary | ICD-10-CM | POA: Diagnosis not present

## 2014-11-20 DIAGNOSIS — R310 Gross hematuria: Secondary | ICD-10-CM | POA: Diagnosis not present

## 2014-11-20 DIAGNOSIS — N200 Calculus of kidney: Secondary | ICD-10-CM | POA: Diagnosis not present

## 2014-12-18 DIAGNOSIS — N200 Calculus of kidney: Secondary | ICD-10-CM | POA: Diagnosis not present

## 2014-12-18 DIAGNOSIS — Q631 Lobulated, fused and horseshoe kidney: Secondary | ICD-10-CM | POA: Diagnosis not present

## 2014-12-18 DIAGNOSIS — R338 Other retention of urine: Secondary | ICD-10-CM | POA: Diagnosis not present

## 2014-12-18 DIAGNOSIS — R34 Anuria and oliguria: Secondary | ICD-10-CM | POA: Diagnosis not present

## 2014-12-18 DIAGNOSIS — R39198 Other difficulties with micturition: Secondary | ICD-10-CM | POA: Diagnosis not present

## 2015-01-14 DIAGNOSIS — Q631 Lobulated, fused and horseshoe kidney: Secondary | ICD-10-CM | POA: Diagnosis not present

## 2015-01-14 DIAGNOSIS — M797 Fibromyalgia: Secondary | ICD-10-CM | POA: Diagnosis not present

## 2015-01-14 DIAGNOSIS — G40909 Epilepsy, unspecified, not intractable, without status epilepticus: Secondary | ICD-10-CM | POA: Diagnosis not present

## 2015-01-14 DIAGNOSIS — N202 Calculus of kidney with calculus of ureter: Secondary | ICD-10-CM | POA: Diagnosis not present

## 2015-01-14 DIAGNOSIS — Z79899 Other long term (current) drug therapy: Secondary | ICD-10-CM | POA: Diagnosis not present

## 2015-01-14 DIAGNOSIS — N200 Calculus of kidney: Secondary | ICD-10-CM | POA: Diagnosis not present

## 2015-01-14 DIAGNOSIS — L309 Dermatitis, unspecified: Secondary | ICD-10-CM | POA: Diagnosis not present

## 2015-01-29 DIAGNOSIS — Q631 Lobulated, fused and horseshoe kidney: Secondary | ICD-10-CM | POA: Diagnosis not present

## 2015-01-29 DIAGNOSIS — N200 Calculus of kidney: Secondary | ICD-10-CM | POA: Diagnosis not present

## 2015-01-29 DIAGNOSIS — Z466 Encounter for fitting and adjustment of urinary device: Secondary | ICD-10-CM | POA: Diagnosis not present

## 2015-02-14 DIAGNOSIS — R39198 Other difficulties with micturition: Secondary | ICD-10-CM | POA: Diagnosis not present

## 2015-02-21 DIAGNOSIS — Z01419 Encounter for gynecological examination (general) (routine) without abnormal findings: Secondary | ICD-10-CM | POA: Diagnosis not present

## 2015-02-21 DIAGNOSIS — Z6827 Body mass index (BMI) 27.0-27.9, adult: Secondary | ICD-10-CM | POA: Diagnosis not present

## 2015-02-21 DIAGNOSIS — Z1231 Encounter for screening mammogram for malignant neoplasm of breast: Secondary | ICD-10-CM | POA: Diagnosis not present

## 2015-03-06 DIAGNOSIS — R351 Nocturia: Secondary | ICD-10-CM | POA: Diagnosis not present

## 2015-03-06 DIAGNOSIS — N952 Postmenopausal atrophic vaginitis: Secondary | ICD-10-CM | POA: Diagnosis not present

## 2015-03-06 DIAGNOSIS — Z87442 Personal history of urinary calculi: Secondary | ICD-10-CM | POA: Diagnosis not present

## 2015-03-12 DIAGNOSIS — N202 Calculus of kidney with calculus of ureter: Secondary | ICD-10-CM | POA: Diagnosis not present

## 2015-03-12 DIAGNOSIS — Z9889 Other specified postprocedural states: Secondary | ICD-10-CM | POA: Diagnosis not present

## 2015-03-12 DIAGNOSIS — Q631 Lobulated, fused and horseshoe kidney: Secondary | ICD-10-CM | POA: Diagnosis not present

## 2015-03-12 DIAGNOSIS — N200 Calculus of kidney: Secondary | ICD-10-CM | POA: Diagnosis not present

## 2015-05-15 ENCOUNTER — Other Ambulatory Visit (HOSPITAL_COMMUNITY): Payer: Self-pay | Admitting: Internal Medicine

## 2015-05-15 ENCOUNTER — Ambulatory Visit (HOSPITAL_COMMUNITY)
Admission: RE | Admit: 2015-05-15 | Discharge: 2015-05-15 | Disposition: A | Discharge status: Home / Self Care | Payer: BLUE CROSS/BLUE SHIELD | Source: Ambulatory Visit | Attending: Cardiovascular Disease | Admitting: Cardiovascular Disease

## 2015-05-15 DIAGNOSIS — I071 Rheumatic tricuspid insufficiency: Secondary | ICD-10-CM | POA: Diagnosis not present

## 2015-05-15 DIAGNOSIS — I340 Nonrheumatic mitral (valve) insufficiency: Secondary | ICD-10-CM | POA: Diagnosis not present

## 2015-05-15 DIAGNOSIS — I5189 Other ill-defined heart diseases: Secondary | ICD-10-CM | POA: Diagnosis not present

## 2015-05-15 DIAGNOSIS — I351 Nonrheumatic aortic (valve) insufficiency: Secondary | ICD-10-CM | POA: Insufficient documentation

## 2015-05-15 DIAGNOSIS — I358 Other nonrheumatic aortic valve disorders: Secondary | ICD-10-CM | POA: Diagnosis not present

## 2015-05-15 DIAGNOSIS — R0600 Dyspnea, unspecified: Secondary | ICD-10-CM | POA: Insufficient documentation

## 2015-07-16 DIAGNOSIS — N200 Calculus of kidney: Secondary | ICD-10-CM | POA: Diagnosis not present

## 2015-08-12 DIAGNOSIS — Z961 Presence of intraocular lens: Secondary | ICD-10-CM | POA: Diagnosis not present

## 2015-08-12 DIAGNOSIS — H40023 Open angle with borderline findings, high risk, bilateral: Secondary | ICD-10-CM | POA: Diagnosis not present

## 2015-09-01 ENCOUNTER — Encounter: Payer: Self-pay | Admitting: Gastroenterology

## 2015-09-02 ENCOUNTER — Encounter: Payer: Self-pay | Admitting: Gastroenterology

## 2015-09-03 MED ORDER — CLONAZEPAM 1 MG PO TABS: ORAL_TABLET | ORAL | Status: DC

## 2015-09-03 MED ORDER — SIMETHICONE 125 MG PO CHEW: 125.0000 mg | CHEWABLE_TABLET | Freq: Two times a day (BID) | ORAL | 0 refills | Status: DC

## 2015-09-03 MED ORDER — POLYETHYLENE GLYCOL 3350 17 GM/SCOOP PO POWD: 17.0000 g | Freq: Two times a day (BID) | ORAL | 3 refills | Status: DC

## 2015-09-03 MED ORDER — AMPHETAMINE-DEXTROAMPHETAMINE 20 MG PO TABS: ORAL_TABLET | ORAL | Status: AC

## 2015-09-03 MED ORDER — VITAMIN D3 50 MCG (2000 UT) PO TABS: 2000.0000 [IU] | ORAL_TABLET | Freq: Every day | ORAL | Status: DC

## 2015-09-03 MED ORDER — EMETROL 1.87-1.87-21.5 PO SOLN: 10.0000 mL | Freq: Two times a day (BID) | ORAL | 0 refills | Status: AC

## 2015-09-03 MED ORDER — FLUTICASONE PROPIONATE 50 MCG/ACT NA SUSP: 1.0000 | Freq: Every day | NASAL | 2 refills | Status: DC

## 2015-09-03 NOTE — Addendum Note (Signed)
Addended by: Marzella Schlein on: 09/03/2015 11:35 AM   Modules accepted: Orders

## 2015-09-03 NOTE — Addendum Note (Signed)
Addended by: Marlon Pel L on: 09/03/2015 10:00 AM   Modules accepted: Orders

## 2015-10-02 DIAGNOSIS — Z13820 Encounter for screening for osteoporosis: Secondary | ICD-10-CM | POA: Diagnosis not present

## 2015-10-31 ENCOUNTER — Ambulatory Visit (AMBULATORY_SURGERY_CENTER): Payer: Self-pay

## 2015-10-31 VITALS — Ht 64.0 in | Wt 156.0 lb

## 2015-10-31 DIAGNOSIS — Z1211 Encounter for screening for malignant neoplasm of colon: Secondary | ICD-10-CM

## 2015-10-31 MED ORDER — SUPREP BOWEL PREP KIT 17.5-3.13-1.6 GM/177ML PO SOLN: 1.0000 | Freq: Once | ORAL | 0 refills | Status: AC

## 2015-10-31 NOTE — Progress Notes (Signed)
No allergies to eggs or soy No past problems with anesthesia except slow recovery No diet meds No home oxygen See attached info regarding persons with chronic fatigue syndrome (per pt's request)  Declined emmi Last seizures '89-'90

## 2015-11-08 ENCOUNTER — Encounter: Payer: Self-pay | Admitting: Gastroenterology

## 2015-11-11 ENCOUNTER — Encounter: Payer: Self-pay | Admitting: Gastroenterology

## 2015-11-11 ENCOUNTER — Ambulatory Visit (AMBULATORY_SURGERY_CENTER): Payer: BLUE CROSS/BLUE SHIELD | Admitting: Gastroenterology

## 2015-11-11 VITALS — BP 132/72 | HR 73 | Temp 98.0°F | Resp 20 | Ht 64.0 in | Wt 156.0 lb

## 2015-11-11 DIAGNOSIS — Z1211 Encounter for screening for malignant neoplasm of colon: Secondary | ICD-10-CM | POA: Diagnosis not present

## 2015-11-11 DIAGNOSIS — F419 Anxiety disorder, unspecified: Secondary | ICD-10-CM | POA: Diagnosis not present

## 2015-11-11 DIAGNOSIS — F909 Attention-deficit hyperactivity disorder, unspecified type: Secondary | ICD-10-CM | POA: Diagnosis not present

## 2015-11-11 DIAGNOSIS — E669 Obesity, unspecified: Secondary | ICD-10-CM | POA: Diagnosis not present

## 2015-11-11 DIAGNOSIS — Z1212 Encounter for screening for malignant neoplasm of rectum: Secondary | ICD-10-CM

## 2015-11-11 MED ORDER — SODIUM CHLORIDE 0.9 % IV SOLN: 500.0000 mL | INTRAVENOUS | Status: DC

## 2015-11-11 NOTE — Op Note (Signed)
[facility] Patient Name: Jean Scott Procedure Date: 11/11/2015 10:34 AM MRN: [id number] Endoscopist: Ladene Artist , MD Age: 65 Referring MD:  Date of Birth: 03-04-1950 Gender: Female Account #: [id number] Procedure:                Colonoscopy Indications:              Screening for colorectal malignant neoplasm Medicines:                Monitored Anesthesia Care Procedure:                Pre-Anesthesia Assessment:                           - Prior to the procedure, a History and Physical                            was performed, and patient medications and                            allergies were reviewed. The patient's tolerance of                            previous anesthesia was also reviewed. The risks                            and benefits of the procedure and the sedation                            options and risks were discussed with the patient.                            All questions were answered, and informed consent                            was obtained. Prior Anticoagulants: The patient has                            taken no previous anticoagulant or antiplatelet                            agents. ASA Grade Assessment: II - A patient with                            mild systemic disease. After reviewing the risks                            and benefits, the patient was deemed in                            satisfactory condition to undergo the procedure.                           After obtaining informed consent, the colonoscope  was passed under direct vision. Throughout the                            procedure, the patient's blood pressure, pulse, and                            oxygen saturations were monitored continuously. The                            Model PCF-H190L [id number]) scope was introduced                            through the anus and advanced to the the cecum,                            identified by  appendiceal orifice and ileocecal                            valve. The ileocecal valve, appendiceal orifice,                            and rectum were photographed. The quality of the                            bowel preparation was excellent. The colonoscopy                            was performed without difficulty. The patient                            tolerated the procedure well. Scope In: 10:51:40 AM Scope Out: 11:05:38 AM Scope Withdrawal Time: 0 hours 11 minutes 41 seconds  Total Procedure Duration: 0 hours 13 minutes 58 seconds  Findings:                 The perianal and digital rectal examinations were                            normal.                           The entire examined colon appeared normal on direct                            and retroflexion views. Complications:            No immediate complications. Estimated blood loss:                            None. Estimated Blood Loss:     Estimated blood loss: none. Impression:               - The entire examined colon is normal on direct and                            retroflexion views.                           -  No specimens collected. Recommendation:           - Repeat colonoscopy in 10 years for screening                            purposes.                           - Patient has a contact number available for                            emergencies. The signs and symptoms of potential                            delayed complications were discussed with the                            patient. Return to normal activities tomorrow.                            Written discharge instructions were provided to the                            patient.                           - Resume previous diet.                           - Continue present medications. Ladene Artist, MD 11/11/2015 11:07:58 AM This report has been signed electronically.

## 2015-11-11 NOTE — Progress Notes (Signed)
Report to PACU, RN, vss, BBS= Clear.  

## 2015-11-11 NOTE — Patient Instructions (Signed)
YOU HAD AN ENDOSCOPIC PROCEDURE TODAY AT THE [facility]:   Refer to the procedure report that was given to you for any specific questions about what was found during the examination.  If the procedure report does not answer your questions, please call your gastroenterologist to clarify.  If you requested that your care partner not be given the details of your procedure findings, then the procedure report has been included in a sealed envelope for you to review at your convenience later.  YOU SHOULD EXPECT: Some feelings of bloating in the abdomen. Passage of more gas than usual.  Walking can help get rid of the air that was put into your GI tract during the procedure and reduce the bloating. If you had a lower endoscopy (such as a colonoscopy or flexible sigmoidoscopy) you may notice spotting of blood in your stool or on the toilet paper. If you underwent a bowel prep for your procedure, you may not have a normal bowel movement for a few days.  Please Note:  You might notice some irritation and congestion in your nose or some drainage.  This is from the oxygen used during your procedure.  There is no need for concern and it should clear up in a day or so.  SYMPTOMS TO REPORT IMMEDIATELY:   Following lower endoscopy (colonoscopy or flexible sigmoidoscopy):  Excessive amounts of blood in the stool  Significant tenderness or worsening of abdominal pains  Swelling of the abdomen that is new, acute  Fever of 100F or higher   For urgent or emergent issues, a gastroenterologist can be reached at any hour by calling [phone].   DIET:  We do recommend a small meal at first, but then you may proceed to your regular diet.  Drink plenty of fluids but you should avoid alcoholic beverages for 24 hours.  ACTIVITY:  You should plan to take it easy for the rest of today and you should NOT DRIVE or use heavy machinery until tomorrow (because of the sedation medicines used during the test).     FOLLOW UP: Our staff will call the number listed on your records the next business day following your procedure to check on you and address any questions or concerns that you may have regarding the information given to you following your procedure. If we do not reach you, we will leave a message.  However, if you are feeling well and you are not experiencing any problems, there is no need to return our call.  We will assume that you have returned to your regular daily activities without incident.  If any biopsies were taken you will be contacted by phone or by letter within the next 1-3 weeks.  Please call us at [phone] if you have not heard about the biopsies in 3 weeks.    SIGNATURES/CONFIDENTIALITY: You and/or your care partner have signed paperwork which will be entered into your electronic medical record.  These signatures attest to the fact that that the information above on your After Visit Summary has been reviewed and is understood.  Full responsibility of the confidentiality of this discharge information lies with you and/or your care-partner.  Thank-you for choosing us for your healthcare needs today. 

## 2015-11-12 ENCOUNTER — Telehealth: Payer: Self-pay | Admitting: *Deleted

## 2015-11-12 NOTE — Telephone Encounter (Signed)
  Follow up Call-  Call back number 11/11/2015  Post procedure Call Back phone  # [phone]  Permission to leave phone message Yes     Patient questions:  Do you have a fever, pain , or abdominal swelling? No. Pain Score  0 *  Have you tolerated food without any problems? Yes.    Have you been able to return to your normal activities? Yes.    Do you have any questions about your discharge instructions: Diet   No. Medications  No. Follow up visit  No.  Do you have questions or concerns about your Care? No.  Actions: * If pain score is 4 or above: No action needed, pain <4.

## 2016-02-23 DIAGNOSIS — Z6826 Body mass index (BMI) 26.0-26.9, adult: Secondary | ICD-10-CM | POA: Diagnosis not present

## 2016-02-23 DIAGNOSIS — Z01419 Encounter for gynecological examination (general) (routine) without abnormal findings: Secondary | ICD-10-CM | POA: Diagnosis not present

## 2016-02-23 DIAGNOSIS — Z1231 Encounter for screening mammogram for malignant neoplasm of breast: Secondary | ICD-10-CM | POA: Diagnosis not present

## 2016-02-26 DIAGNOSIS — M25511 Pain in right shoulder: Secondary | ICD-10-CM | POA: Diagnosis not present

## 2016-03-01 DIAGNOSIS — M25511 Pain in right shoulder: Secondary | ICD-10-CM | POA: Diagnosis not present

## 2016-03-09 DIAGNOSIS — M25511 Pain in right shoulder: Secondary | ICD-10-CM | POA: Diagnosis not present

## 2016-03-16 DIAGNOSIS — M25511 Pain in right shoulder: Secondary | ICD-10-CM | POA: Diagnosis not present

## 2016-03-29 DIAGNOSIS — N200 Calculus of kidney: Secondary | ICD-10-CM | POA: Diagnosis not present

## 2016-03-29 DIAGNOSIS — N952 Postmenopausal atrophic vaginitis: Secondary | ICD-10-CM | POA: Diagnosis not present

## 2016-03-30 DIAGNOSIS — M25511 Pain in right shoulder: Secondary | ICD-10-CM | POA: Diagnosis not present

## 2016-04-06 DIAGNOSIS — M25511 Pain in right shoulder: Secondary | ICD-10-CM | POA: Diagnosis not present

## 2016-04-13 DIAGNOSIS — M25511 Pain in right shoulder: Secondary | ICD-10-CM | POA: Diagnosis not present

## 2016-04-20 DIAGNOSIS — M25511 Pain in right shoulder: Secondary | ICD-10-CM | POA: Diagnosis not present

## 2016-04-27 DIAGNOSIS — M25511 Pain in right shoulder: Secondary | ICD-10-CM | POA: Diagnosis not present

## 2016-05-04 DIAGNOSIS — M25511 Pain in right shoulder: Secondary | ICD-10-CM | POA: Diagnosis not present

## 2016-09-14 ENCOUNTER — Telehealth: Payer: Self-pay | Admitting: Gastroenterology

## 2016-09-14 NOTE — Telephone Encounter (Signed)
Patient with several week history of watery diarrhea.  She has tried OTC anti-diarrheal medications with little relief.  She will come in tomorrow at 3:00 to see Tye Savoy RNP

## 2016-09-15 ENCOUNTER — Other Ambulatory Visit: Payer: Medicare Other

## 2016-09-15 ENCOUNTER — Encounter: Payer: Self-pay | Admitting: Nurse Practitioner

## 2016-09-15 ENCOUNTER — Ambulatory Visit (INDEPENDENT_AMBULATORY_CARE_PROVIDER_SITE_OTHER): Payer: PRIVATE HEALTH INSURANCE | Admitting: Nurse Practitioner

## 2016-09-15 VITALS — BP 138/74 | HR 82 | Ht 64.0 in | Wt 154.0 lb

## 2016-09-15 DIAGNOSIS — R197 Diarrhea, unspecified: Secondary | ICD-10-CM | POA: Diagnosis not present

## 2016-09-15 NOTE — Patient Instructions (Addendum)
Hold Imodium until further notice We will call you with results of stool study Please give [place] a call late next week with a condition update.  Your physician has requested that you go to the basement for the following lab work before leaving today: C Diff  If you are age 66 or older, your body mass index should be between 23-30. Your Body mass index is 26.43 kg/m. If this is out of the aforementioned range listed, please consider follow up with your Primary Care Provider.  If you are age 35 or younger, your body mass index should be between 19-25. Your Body mass index is 26.43 kg/m. If this is out of the aformentioned range listed, please consider follow up with your Primary Care Provider.    Thank you for choosing me and [facility].   Tye Savoy, NP

## 2016-09-15 NOTE — Progress Notes (Signed)
     HPI: Patient is a 66 year old female here for evaluation of loose stool. Patient is known to Dr. Fuller Plan. She had a colonoscopy in 2017 and since then has needed MiraLAX as needed for constipation. In early August she was given antibiotics for a urinary tract infection. While on antibiotics patient developed loose stools. She was having several bowel movements a day. She started Activa yogurt, diarrhea got worse. She stopped the activity and started a probiotic. She is still having several loose bowel movements a day. Patient took a dose of Imodium yesterday, no bowel movements since. She has not had any nocturnal stools. The loose stools have not been associated with fever, nausea or vomiting. There's been no blood in her stools. Other the antibiotics she has not started nor stopped any of her home medications. Until she took imodium everything she ate caused diarrhea.    Past Medical History:  Diagnosis Date  . Allergic rhinitis    Dust mites, mold, plant polens  . Anemia 1970's  . Arm fracture 1957  . Depression 1991   6 months  . Elevated uric acid in blood   . Fibroids   . Fibromyalgia 1990  . Fibrosis, breast    Mammogram  . Glucose intolerance (impaired glucose tolerance)   . Grand mal seizure ([place]) 1960  . Hemorrhoids, internal [id number]  . History of hypercalcemia   . Hyperlipidemia   . Hypothyroidism 1990  . Osteopenia 2012   Bone density Test  . Seizures ([place])   . Urinary tract infection     Patient's surgical history, family medical history, social history, medications and allergies were all reviewed in Epic    Physical Exam: BP 138/74   Pulse 82   Ht 5\' 4"  (1.626 m)   Wt 154 lb (69.9 kg)   BMI 26.43 kg/m   GENERAL: well developed white female in NAD PSYCH: :Pleasant, cooperative, normal affect EENT:  conjunctiva pink, mucous membranes moist, neck supple without masses CARDIAC:  RRR, no murmur heard, no peripheral edema PULM: Normal respiratory effort,  lungs CTA bilaterally, no wheezing ABDOMEN:  soft, nontender, nondistended, no obvious masses, no hepatomegaly,  normal bowel sounds SKIN:  turgor, no lesions seen Musculoskeletal:  Normal muscle tone, normal strength NEURO: Alert and oriented x 3, no focal neurologic deficits    ASSESSMENT and PLAN:  Pleasant 66 year old female with frequent loose stool since taking antibiotics. Non-toxic appearing. Abdominal exam unremarkable.  -Hold imodium. If recurrent loose stool then need to submit asap for c-diff. Will call her with results    Tye Savoy , NP 09/15/2016, 3:40 PM

## 2016-09-16 ENCOUNTER — Telehealth: Payer: Self-pay | Admitting: Nurse Practitioner

## 2016-09-16 ENCOUNTER — Other Ambulatory Visit: Payer: Medicare Other

## 2016-09-16 DIAGNOSIS — R197 Diarrhea, unspecified: Secondary | ICD-10-CM | POA: Diagnosis not present

## 2016-09-16 NOTE — Telephone Encounter (Signed)
Patient turned in her stool specimen today and was hoping it had resulted. She is being checked for C Diff. Explained to patient this test will take a few days to result. Assured she will be contacted with her results and recommendations as soon as the results are available.

## 2016-09-18 ENCOUNTER — Telehealth: Payer: Self-pay | Admitting: Internal Medicine

## 2016-09-18 LAB — CLOSTRIDIUM DIFFICILE BY PCR: Toxigenic C. Difficile by PCR: POSITIVE — AB

## 2016-09-18 NOTE — Telephone Encounter (Signed)
Patient seen by nurse practitioner 9-12. Had stool for C.diff tested. She called tonight requesting result. Result was positive and resulted 9-13. I informed her of the result. She tells me that she tolerates metronidazole. Metronidazole 250 mg qid x 7 days prescribed to [employer] [facility] at [facility]. She agree to to contact the office with follow up on 9-18, sooner if needed. Note forwarded to Dr. Fuller Plan and his staff.

## 2016-09-20 MED ORDER — VANCOMYCIN HCL 125 MG PO CAPS: 125.0000 mg | ORAL_CAPSULE | Freq: Four times a day (QID) | ORAL | 0 refills | Status: DC

## 2016-09-20 NOTE — Telephone Encounter (Signed)
DC metronidazole  Vanco 125 mg po qid x 10 days Florastor bid for 6 weeks

## 2016-09-20 NOTE — Telephone Encounter (Signed)
Pt aware, script sent in for vacomycin.

## 2016-09-20 NOTE — Addendum Note (Signed)
Addended by: Rosanne Sack R on: 09/20/2016 09:05 AM   Modules accepted: Orders

## 2016-09-20 NOTE — Progress Notes (Signed)
Reviewed and agree with initial management plan.  Jacolby Risby T. Korbyn Chopin, MD FACG 

## 2016-09-24 ENCOUNTER — Telehealth: Payer: Self-pay | Admitting: Nurse Practitioner

## 2016-09-24 NOTE — Telephone Encounter (Signed)
Thanks for update. Continue present course. If miserable she can take imodium 1-2 times a day. Call with another update when done with antibiotics. Thanks

## 2016-09-24 NOTE — Telephone Encounter (Signed)
Patient reports that she is still having loose stools.  She had two loose stools so far.  She has 5 days left of vancomycin.  She was asked to call at the end of the week with an update

## 2016-09-24 NOTE — Telephone Encounter (Signed)
Patient notified

## 2016-10-13 DIAGNOSIS — E038 Other specified hypothyroidism: Secondary | ICD-10-CM | POA: Diagnosis not present

## 2016-10-14 DIAGNOSIS — G933 Postviral fatigue syndrome: Secondary | ICD-10-CM | POA: Diagnosis not present

## 2016-10-14 DIAGNOSIS — E032 Hypothyroidism due to medicaments and other exogenous substances: Secondary | ICD-10-CM | POA: Diagnosis not present

## 2016-10-14 DIAGNOSIS — K580 Irritable bowel syndrome with diarrhea: Secondary | ICD-10-CM | POA: Diagnosis not present

## 2016-10-14 DIAGNOSIS — E559 Vitamin D deficiency, unspecified: Secondary | ICD-10-CM | POA: Diagnosis not present

## 2016-10-14 DIAGNOSIS — M797 Fibromyalgia: Secondary | ICD-10-CM | POA: Diagnosis not present

## 2016-10-17 ENCOUNTER — Encounter: Payer: Self-pay | Admitting: Nurse Practitioner

## 2016-10-20 ENCOUNTER — Ambulatory Visit (INDEPENDENT_AMBULATORY_CARE_PROVIDER_SITE_OTHER): Payer: PRIVATE HEALTH INSURANCE | Admitting: Nurse Practitioner

## 2016-10-20 ENCOUNTER — Other Ambulatory Visit (INDEPENDENT_AMBULATORY_CARE_PROVIDER_SITE_OTHER): Payer: PRIVATE HEALTH INSURANCE

## 2016-10-20 ENCOUNTER — Encounter: Payer: Self-pay | Admitting: Nurse Practitioner

## 2016-10-20 VITALS — BP 126/78 | HR 80 | Temp 97.7°F | Ht 64.0 in | Wt 153.6 lb

## 2016-10-20 DIAGNOSIS — K580 Irritable bowel syndrome with diarrhea: Secondary | ICD-10-CM | POA: Diagnosis not present

## 2016-10-20 DIAGNOSIS — A09 Infectious gastroenteritis and colitis, unspecified: Secondary | ICD-10-CM | POA: Diagnosis not present

## 2016-10-20 DIAGNOSIS — R194 Change in bowel habit: Secondary | ICD-10-CM

## 2016-10-20 DIAGNOSIS — A0472 Enterocolitis due to Clostridium difficile, not specified as recurrent: Secondary | ICD-10-CM | POA: Diagnosis not present

## 2016-10-20 LAB — CBC
HEMATOCRIT: 40.6 % (ref 36.0–46.0)
HEMOGLOBIN: 13.2 g/dL (ref 12.0–15.0)
MCHC: 32.6 g/dL (ref 30.0–36.0)
MCV: 89.3 fl (ref 78.0–100.0)
PLATELETS: 264 10*3/uL (ref 150.0–400.0)
RBC: 4.54 Mil/uL (ref 3.87–5.11)
RDW: 12.8 % (ref 11.5–15.5)
WBC: 7.9 10*3/uL (ref 4.0–10.5)

## 2016-10-20 MED ORDER — RIFAXIMIN 550 MG PO TABS: 550.0000 mg | ORAL_TABLET | Freq: Three times a day (TID) | ORAL | 0 refills | Status: DC

## 2016-10-20 NOTE — Patient Instructions (Addendum)
If you are age 66 or older, your body mass index should be between 23-30. Your Body mass index is 26.37 kg/m. If this is out of the aforementioned range listed, please consider follow up with your Primary Care Provider.  If you are age 59 or younger, your body mass index should be between 19-25. Your Body mass index is 26.37 kg/m. If this is out of the aformentioned range listed, please consider follow up with your Primary Care Provider.   Your physician has requested that you go to the basement for the following lab work before leaving today: C Diff Lactoferrin CBC  We have sent the following medications to your pharmacy for you to pick up at your convenience: Xifaxan - [facility] will contact you.  Follow up with Dr. Fuller Plan on December 08, 2016 at 11:15 a.m.  Thank you for choosing me and [facility].   Tye Savoy, NP

## 2016-10-20 NOTE — Progress Notes (Signed)
Chief Complaint:  Still with irregular BMs.   HPI: Patient is a 66 year old female known to Dr. Fuller Plan. She has a hx of GERD and presumed IBS. I saw her mid Sept with bowel changes after taking antibiotics. C-diff returned positive. Flagyl was called in on the weekend by GI physician on call when results received . Dr. Fuller Plan changed her to Vancomycin on Monday morning. She completed 10 days but stools were still loose. She decided to complete the remaining course of flagyl and added florastor.  The loose stool did improve within a few days of flagyl but still not back to having normal stools and so was worked in for follow up today   She is having several small bowel BMs a day, The succession of low volume BMs mainly occurs in the first part of the day. Stool consists of small soft balls which dissiapate with flushing.  No associated cramps. Stools do not contain any blood. She complains of weakness and fatigue. Her weight is stable. No recent medication change except for for thyroid medication. She has chronic fatigue syndrome. On Adderall since 1996 for the fatigue. She takes Klonipin for sleep and tries to pace her activities to help with the chronic fatigue. . .     Past Medical History:  Diagnosis Date  . Allergic rhinitis    Dust mites, mold, plant polens  . Anemia 1970's  . Arm fracture 1957  . Depression 1991   6 months  . Elevated uric acid in blood   . Fibroids   . Fibromyalgia 1990  . Fibrosis, breast    Mammogram  . Glucose intolerance (impaired glucose tolerance)   . Grand mal seizure ([place]) 1960  . Hemorrhoids, internal [id number]  . History of hypercalcemia   . Hyperlipidemia   . Hypothyroidism 1990  . Osteopenia 2012   Bone density Test  . Seizures ([place])   . Urinary tract infection     Patient's surgical history, family medical history, social history, medications and allergies were all reviewed in Epic    Physical Exam: BP 126/78   Pulse 80   Temp 97.7 F  (36.5 C) (Oral)   Ht 5\' 4"  (1.626 m)   Wt 153 lb 9.6 oz (69.7 kg)   BMI 26.37 kg/m   GENERAL:  Well developed white female in NAD PSYCH: :Pleasant, cooperative, normal affect EENT:  conjunctiva pink, mucous membranes moist, neck supple without masses CARDIAC:  RRR, soft murmur heard, no peripheral edema PULM: Normal respiratory effort, lungs CTA bilaterally, no wheezing ABDOMEN:  Nondistended, soft, nontender. No obvious masses, no hepatomegaly,  normal bowel sounds SKIN:  turgor, no lesions seen Musculoskeletal:  Normal muscle tone, normal strength NEURO: Alert and oriented x 3, no focal neurologic deficits    ASSESSMENT and PLAN:  Recent c-diff. Improved but still with persitently abnormal BMs.. She describes hyperdfefecation with passage of several small poorly formed stools before finally feeling evacuated. Similar problems in past, responded to Flagyl for possible SIBO. Felt in past to have IBS which could be etiology of current symptoms. She had a normal colonoscopy in Nov 2017 -I doubt persistent c-diff infection but will send stools for C-diff and lactoferrin.  -fatigue. I suspect this is part of her chronic fatigue but will check a CBC.  -Trial of Xifaxan 550 mg  3 times a day for 14 days for IBS and possible SIBO . -follow up in 6-8 weeks for persistent symptoms  Tye Savoy , NP 10/20/2016, 2:55 PM

## 2016-10-21 ENCOUNTER — Telehealth: Payer: Self-pay | Admitting: Internal Medicine

## 2016-10-21 ENCOUNTER — Other Ambulatory Visit: Payer: PRIVATE HEALTH INSURANCE

## 2016-10-21 ENCOUNTER — Telehealth: Payer: Self-pay | Admitting: Nurse Practitioner

## 2016-10-21 DIAGNOSIS — A09 Infectious gastroenteritis and colitis, unspecified: Secondary | ICD-10-CM

## 2016-10-21 DIAGNOSIS — R194 Change in bowel habit: Secondary | ICD-10-CM

## 2016-10-21 NOTE — Telephone Encounter (Signed)
Patient with hx C.diff. Saw Paula yesterday (reviewed). Complains of abnormal looking bowels, intermittent diarrhea, and low grade temp tonight at 100.4. Last fever one week ago. Feels week and frustrated. No abdominal pain save cramping pre-defecation. Submitted stool studies today. I told her to contact office in am to follow up on test result and further recommendations. To ER if things worsen.

## 2016-10-22 LAB — FECAL LACTOFERRIN, QUANT
Fecal Lactoferrin: POSITIVE — AB
MICRO NUMBER:: 81165489
SPECIMEN QUALITY: ADEQUATE

## 2016-10-22 LAB — CLOSTRIDIUM DIFFICILE BY PCR

## 2016-10-22 NOTE — Telephone Encounter (Signed)
Spoke with the patient who states she is not eating anything because "it goes straight through me." She feels weak. She is consuming only water and some tea. Asked if she would try broth or sports drink. Patient does not say she will do this.Xifaxan is under review with her insurance company. Patient admits she feels very bad and she is very concerned that she cannot get better.

## 2016-10-22 NOTE — Telephone Encounter (Signed)
Patient states that her fever is back. 100.6 and is still not feeling well.

## 2016-10-23 ENCOUNTER — Encounter: Payer: Self-pay | Admitting: Nurse Practitioner

## 2016-10-23 ENCOUNTER — Encounter: Payer: Self-pay | Admitting: Gastroenterology

## 2016-10-25 NOTE — Progress Notes (Signed)
Reviewed and agree with initial management plan.  Roshad Hack T. Coralynn Gaona, MD FACG 

## 2016-10-26 ENCOUNTER — Other Ambulatory Visit: Payer: Self-pay

## 2016-10-26 ENCOUNTER — Other Ambulatory Visit: Payer: PRIVATE HEALTH INSURANCE

## 2016-10-26 ENCOUNTER — Telehealth: Payer: Self-pay | Admitting: Gastroenterology

## 2016-10-26 DIAGNOSIS — E860 Dehydration: Secondary | ICD-10-CM

## 2016-10-26 DIAGNOSIS — R197 Diarrhea, unspecified: Secondary | ICD-10-CM

## 2016-10-26 NOTE — Telephone Encounter (Signed)
Called back to patient. Left message on the voicemail. Will need to repeat the C-diff by PCR test using a different lab.

## 2016-10-26 NOTE — Telephone Encounter (Signed)
Spoke with the patient. She agrees to re-testing for C diff by PCR with a different lab ([facility]) and will pick up her kit today. She reports she continues to have water stools in excess of 6 stools. She reports she has to go urgently. No bloody diarrhea. She is afebrile now.

## 2016-10-27 ENCOUNTER — Other Ambulatory Visit: Payer: PRIVATE HEALTH INSURANCE

## 2016-10-27 DIAGNOSIS — E860 Dehydration: Secondary | ICD-10-CM | POA: Diagnosis not present

## 2016-10-27 DIAGNOSIS — R197 Diarrhea, unspecified: Secondary | ICD-10-CM

## 2016-10-30 LAB — CLOSTRIDIUM DIFFICILE BY PCR: CDIFFPCR: POSITIVE — AB

## 2016-11-01 ENCOUNTER — Other Ambulatory Visit: Payer: Self-pay

## 2016-11-01 ENCOUNTER — Telehealth: Payer: Self-pay | Admitting: Nurse Practitioner

## 2016-11-01 MED ORDER — VANCOMYCIN HCL 125 MG PO CAPS: ORAL_CAPSULE | ORAL | 0 refills | Status: DC

## 2016-11-01 NOTE — Telephone Encounter (Signed)
Sending stool to another lab for testing.

## 2016-11-01 NOTE — Telephone Encounter (Signed)
Addressed through other phone conversations.

## 2016-11-05 NOTE — Telephone Encounter (Signed)
Per Nevin Bloodgood patient does not need Xifaxan.  I faxed back decision note to [facility] letting them know that patient does not need this medication at this time.

## 2016-11-21 ENCOUNTER — Other Ambulatory Visit: Payer: Self-pay | Admitting: Nurse Practitioner

## 2016-11-22 ENCOUNTER — Other Ambulatory Visit: Payer: Self-pay

## 2016-11-22 ENCOUNTER — Encounter: Payer: Self-pay | Admitting: Nurse Practitioner

## 2016-11-23 ENCOUNTER — Other Ambulatory Visit: Payer: Self-pay

## 2016-11-23 ENCOUNTER — Telehealth: Payer: Self-pay | Admitting: Nurse Practitioner

## 2016-11-23 MED ORDER — VANCOMYCIN HCL 125 MG PO CAPS: ORAL_CAPSULE | ORAL | 0 refills | Status: DC

## 2016-11-23 NOTE — Telephone Encounter (Signed)
Jean Scott from [facility] states that pt did not xifaxan medication due to just starting other medication. Patient requesting call from office to discuss

## 2016-11-24 NOTE — Telephone Encounter (Signed)
Xifaxan was prescribed in October but never obtained. She says it is $200.00 and not obtainable for her financially. She is on Vancomycin at this time. Scheduled for follow up in December.

## 2016-12-08 ENCOUNTER — Ambulatory Visit (INDEPENDENT_AMBULATORY_CARE_PROVIDER_SITE_OTHER): Payer: PRIVATE HEALTH INSURANCE | Admitting: Gastroenterology

## 2016-12-08 ENCOUNTER — Encounter: Payer: Self-pay | Admitting: Gastroenterology

## 2016-12-08 VITALS — BP 144/82 | HR 76 | Ht 63.5 in | Wt 153.5 lb

## 2016-12-08 DIAGNOSIS — A0472 Enterocolitis due to Clostridium difficile, not specified as recurrent: Secondary | ICD-10-CM

## 2016-12-08 NOTE — Patient Instructions (Signed)
Remain on a lactose free diet until your symptoms resolve.   Avoid all fruits and vegetables, except bananas until symptoms resolve.   Call back in one month if symptoms do not resolve.   Thank you for choosing me and [facility].  Pricilla Riffle. Dagoberto Ligas., MD., Marval Regal

## 2016-12-08 NOTE — Progress Notes (Signed)
    History of Present Illness: This is a 66 year old female with C. difficile.  She is completing pulse taper course of vancomycin and has only a few days left.  She has had a substantial improvement in symptoms however she notes that her stools are not completely formed.  She is having 1-2 semi-formed to loose stools each day without bleeding for the past couple weeks.   Current Medications, Allergies, Past Medical History, Past Surgical History, Family History and Social History were reviewed in [employer] record.  Physical Exam: General: Well developed, well nourished, no acute distress Head: Normocephalic and atraumatic Eyes:  sclerae anicteric, EOMI Ears: Normal auditory acuity Mouth: No deformity or lesions Lungs: Clear throughout to auscultation Heart: Regular rate and rhythm; no murmurs, rubs or bruits Abdomen: Soft, non tender and non distended. No masses, hepatosplenomegaly or hernias noted. Normal Bowel sounds Rectal: not done Musculoskeletal: Symmetrical with no gross deformities  Pulses:  Normal pulses noted Extremities: No clubbing, cyanosis, edema or deformities noted Neurological: Alert oriented x 4, grossly nonfocal Psychological:  Alert and cooperative. Normal mood and affect  Assessment and Recommendations:  1.  C. difficile diarrhea, resolving.  Complete vancomycin pulse taper as prescribed.  She may have a postinfectious component to her current symptoms.  We discussed dietary restrictions to include a lactose-free diet, low-fat diet and avoidance of raw fruits and vegetables except for bananas.  As her stool form returns to normal she can gradually add these foods back into her diet per her preferences.  Call if symptoms fail to resolve over the next month.  I spent 15 minutes of face-to-face time with the patient. Greater than 50% of the time was spent counseling and coordinating care.

## 2017-01-05 DIAGNOSIS — M79605 Pain in left leg: Secondary | ICD-10-CM | POA: Diagnosis not present

## 2017-01-18 DIAGNOSIS — M79605 Pain in left leg: Secondary | ICD-10-CM | POA: Diagnosis not present

## 2017-02-01 DIAGNOSIS — M79605 Pain in left leg: Secondary | ICD-10-CM | POA: Diagnosis not present

## 2017-03-07 DIAGNOSIS — R5382 Chronic fatigue, unspecified: Secondary | ICD-10-CM | POA: Diagnosis not present

## 2017-03-07 DIAGNOSIS — Z6827 Body mass index (BMI) 27.0-27.9, adult: Secondary | ICD-10-CM | POA: Diagnosis not present

## 2017-03-07 DIAGNOSIS — Z79899 Other long term (current) drug therapy: Secondary | ICD-10-CM | POA: Diagnosis not present

## 2017-03-09 DIAGNOSIS — Z6826 Body mass index (BMI) 26.0-26.9, adult: Secondary | ICD-10-CM | POA: Diagnosis not present

## 2017-03-09 DIAGNOSIS — Z1231 Encounter for screening mammogram for malignant neoplasm of breast: Secondary | ICD-10-CM | POA: Diagnosis not present

## 2017-03-09 DIAGNOSIS — Z01419 Encounter for gynecological examination (general) (routine) without abnormal findings: Secondary | ICD-10-CM | POA: Diagnosis not present

## 2017-03-30 DIAGNOSIS — N3020 Other chronic cystitis without hematuria: Secondary | ICD-10-CM | POA: Diagnosis not present

## 2017-04-07 DIAGNOSIS — D692 Other nonthrombocytopenic purpura: Secondary | ICD-10-CM | POA: Diagnosis not present

## 2017-04-07 DIAGNOSIS — L821 Other seborrheic keratosis: Secondary | ICD-10-CM | POA: Diagnosis not present

## 2017-04-07 DIAGNOSIS — L578 Other skin changes due to chronic exposure to nonionizing radiation: Secondary | ICD-10-CM | POA: Diagnosis not present

## 2017-04-07 DIAGNOSIS — L814 Other melanin hyperpigmentation: Secondary | ICD-10-CM | POA: Diagnosis not present

## 2017-06-06 DIAGNOSIS — R05 Cough: Secondary | ICD-10-CM | POA: Diagnosis not present

## 2017-06-06 DIAGNOSIS — Z6827 Body mass index (BMI) 27.0-27.9, adult: Secondary | ICD-10-CM | POA: Diagnosis not present

## 2017-06-06 DIAGNOSIS — Z79899 Other long term (current) drug therapy: Secondary | ICD-10-CM | POA: Diagnosis not present

## 2017-07-26 DIAGNOSIS — Z961 Presence of intraocular lens: Secondary | ICD-10-CM | POA: Diagnosis not present

## 2017-07-26 DIAGNOSIS — H04123 Dry eye syndrome of bilateral lacrimal glands: Secondary | ICD-10-CM | POA: Diagnosis not present

## 2017-07-26 DIAGNOSIS — H40023 Open angle with borderline findings, high risk, bilateral: Secondary | ICD-10-CM | POA: Diagnosis not present

## 2017-08-03 DIAGNOSIS — R300 Dysuria: Secondary | ICD-10-CM | POA: Diagnosis not present

## 2017-08-03 DIAGNOSIS — N390 Urinary tract infection, site not specified: Secondary | ICD-10-CM | POA: Diagnosis not present

## 2017-08-26 DIAGNOSIS — M859 Disorder of bone density and structure, unspecified: Secondary | ICD-10-CM | POA: Diagnosis not present

## 2017-08-26 DIAGNOSIS — R7301 Impaired fasting glucose: Secondary | ICD-10-CM | POA: Diagnosis not present

## 2017-08-26 DIAGNOSIS — R82998 Other abnormal findings in urine: Secondary | ICD-10-CM | POA: Diagnosis not present

## 2017-08-26 DIAGNOSIS — E7849 Other hyperlipidemia: Secondary | ICD-10-CM | POA: Diagnosis not present

## 2017-08-26 DIAGNOSIS — E038 Other specified hypothyroidism: Secondary | ICD-10-CM | POA: Diagnosis not present

## 2017-08-26 DIAGNOSIS — Z0000 Encounter for general adult medical examination without abnormal findings: Secondary | ICD-10-CM | POA: Diagnosis not present

## 2017-09-12 DIAGNOSIS — Z6827 Body mass index (BMI) 27.0-27.9, adult: Secondary | ICD-10-CM | POA: Diagnosis not present

## 2017-09-12 DIAGNOSIS — R5382 Chronic fatigue, unspecified: Secondary | ICD-10-CM | POA: Diagnosis not present

## 2017-09-12 DIAGNOSIS — R7301 Impaired fasting glucose: Secondary | ICD-10-CM | POA: Diagnosis not present

## 2017-09-12 DIAGNOSIS — I519 Heart disease, unspecified: Secondary | ICD-10-CM | POA: Diagnosis not present

## 2017-09-12 DIAGNOSIS — Z79899 Other long term (current) drug therapy: Secondary | ICD-10-CM | POA: Diagnosis not present

## 2017-09-12 DIAGNOSIS — M859 Disorder of bone density and structure, unspecified: Secondary | ICD-10-CM | POA: Diagnosis not present

## 2017-09-12 DIAGNOSIS — E7849 Other hyperlipidemia: Secondary | ICD-10-CM | POA: Diagnosis not present

## 2017-09-12 DIAGNOSIS — Z1389 Encounter for screening for other disorder: Secondary | ICD-10-CM | POA: Diagnosis not present

## 2017-09-12 DIAGNOSIS — R569 Unspecified convulsions: Secondary | ICD-10-CM | POA: Diagnosis not present

## 2017-09-12 DIAGNOSIS — E038 Other specified hypothyroidism: Secondary | ICD-10-CM | POA: Diagnosis not present

## 2017-09-12 DIAGNOSIS — Z0000 Encounter for general adult medical examination without abnormal findings: Secondary | ICD-10-CM | POA: Diagnosis not present

## 2017-09-12 DIAGNOSIS — J3089 Other allergic rhinitis: Secondary | ICD-10-CM | POA: Diagnosis not present

## 2017-09-16 DIAGNOSIS — Z1212 Encounter for screening for malignant neoplasm of rectum: Secondary | ICD-10-CM | POA: Diagnosis not present

## 2017-10-19 DIAGNOSIS — N958 Other specified menopausal and perimenopausal disorders: Secondary | ICD-10-CM | POA: Diagnosis not present

## 2017-10-19 DIAGNOSIS — M8588 Other specified disorders of bone density and structure, other site: Secondary | ICD-10-CM | POA: Diagnosis not present

## 2017-10-25 DIAGNOSIS — R300 Dysuria: Secondary | ICD-10-CM | POA: Diagnosis not present

## 2017-10-25 DIAGNOSIS — N390 Urinary tract infection, site not specified: Secondary | ICD-10-CM | POA: Diagnosis not present

## 2017-11-04 DIAGNOSIS — R109 Unspecified abdominal pain: Secondary | ICD-10-CM | POA: Diagnosis not present

## 2017-11-04 DIAGNOSIS — R1032 Left lower quadrant pain: Secondary | ICD-10-CM | POA: Diagnosis not present

## 2017-11-04 DIAGNOSIS — N390 Urinary tract infection, site not specified: Secondary | ICD-10-CM | POA: Diagnosis not present

## 2017-11-04 DIAGNOSIS — Z6827 Body mass index (BMI) 27.0-27.9, adult: Secondary | ICD-10-CM | POA: Diagnosis not present

## 2017-11-04 DIAGNOSIS — N200 Calculus of kidney: Secondary | ICD-10-CM | POA: Diagnosis not present

## 2017-11-08 DIAGNOSIS — R1032 Left lower quadrant pain: Secondary | ICD-10-CM | POA: Diagnosis not present

## 2017-11-11 DIAGNOSIS — N200 Calculus of kidney: Secondary | ICD-10-CM | POA: Diagnosis not present

## 2017-11-11 DIAGNOSIS — N3020 Other chronic cystitis without hematuria: Secondary | ICD-10-CM | POA: Diagnosis not present

## 2017-12-12 DIAGNOSIS — Z6827 Body mass index (BMI) 27.0-27.9, adult: Secondary | ICD-10-CM | POA: Diagnosis not present

## 2017-12-12 DIAGNOSIS — R5382 Chronic fatigue, unspecified: Secondary | ICD-10-CM | POA: Diagnosis not present

## 2017-12-12 DIAGNOSIS — Z79899 Other long term (current) drug therapy: Secondary | ICD-10-CM | POA: Diagnosis not present

## 2018-01-17 DIAGNOSIS — N390 Urinary tract infection, site not specified: Secondary | ICD-10-CM | POA: Diagnosis not present

## 2018-01-20 DIAGNOSIS — R05 Cough: Secondary | ICD-10-CM | POA: Diagnosis not present

## 2018-01-20 DIAGNOSIS — R310 Gross hematuria: Secondary | ICD-10-CM | POA: Diagnosis not present

## 2018-01-20 DIAGNOSIS — E039 Hypothyroidism, unspecified: Secondary | ICD-10-CM | POA: Diagnosis not present

## 2018-01-20 DIAGNOSIS — E785 Hyperlipidemia, unspecified: Secondary | ICD-10-CM | POA: Diagnosis not present

## 2018-01-20 DIAGNOSIS — R0609 Other forms of dyspnea: Secondary | ICD-10-CM | POA: Diagnosis not present

## 2018-01-20 DIAGNOSIS — Z6827 Body mass index (BMI) 27.0-27.9, adult: Secondary | ICD-10-CM | POA: Diagnosis not present

## 2018-01-20 DIAGNOSIS — R531 Weakness: Secondary | ICD-10-CM | POA: Diagnosis not present

## 2018-02-07 DIAGNOSIS — L718 Other rosacea: Secondary | ICD-10-CM | POA: Diagnosis not present

## 2018-02-14 ENCOUNTER — Encounter: Payer: Self-pay | Admitting: Cardiovascular Disease

## 2018-02-14 ENCOUNTER — Ambulatory Visit: Payer: PPO | Admitting: Cardiovascular Disease

## 2018-02-14 VITALS — BP 162/98 | HR 78 | Ht 64.0 in | Wt 154.6 lb

## 2018-02-14 DIAGNOSIS — R0609 Other forms of dyspnea: Secondary | ICD-10-CM | POA: Diagnosis not present

## 2018-02-14 DIAGNOSIS — R0602 Shortness of breath: Secondary | ICD-10-CM

## 2018-02-14 DIAGNOSIS — E785 Hyperlipidemia, unspecified: Secondary | ICD-10-CM | POA: Insufficient documentation

## 2018-02-14 NOTE — Patient Instructions (Signed)
Medication Instructions:  Your physician recommends that you continue on your current medications as directed. Please refer to the Current Medication list given to you today.   If you need a refill on your cardiac medications before your next appointment, please call your pharmacy.   Lab work: NONE If you have labs (blood work) drawn today and your tests are completely normal, you will receive your results only by: Marland Kitchen MyChart Message (if you have MyChart) OR . A paper copy in the mail If you have any lab test that is abnormal or we need to change your treatment, we will call you to review the results.  Testing/Procedures: Your physician has requested that you have an echocardiogram. Echocardiography is a painless test that uses sound waves to create images of your heart. It provides your doctor with information about the size and shape of your heart and how well your heart's chambers and valves are working. This procedure takes approximately one hour. There are no restrictions for this procedure.   Your physician recommends the following:  Coronary Calcium Scan A coronary calcium scan is an imaging test used to look for deposits of calcium and other fatty materials (plaques) in the inner lining of the blood vessels of the heart (coronary arteries). These deposits of calcium and plaques can partly clog and narrow the coronary arteries without producing any symptoms or warning signs. This puts a person at risk for a heart attack. This test can detect these deposits before symptoms develop. Tell a health care provider about:  Any allergies you have.  All medicines you are taking, including vitamins, herbs, eye drops, creams, and over-the-counter medicines.  Any problems you or family members have had with anesthetic medicines.  Any blood disorders you have.  Any surgeries you have had.  Any medical conditions you have.  Whether you are pregnant or may be pregnant. What are the  risks? Generally, this is a safe procedure. However, problems may occur, including:  Harm to a pregnant woman and her unborn baby. This test involves the use of radiation. Radiation exposure can be dangerous to a pregnant woman and her unborn baby. If you are pregnant, you generally should not have this procedure done.  Slight increase in the risk of cancer. This is because of the radiation involved in the test. What happens before the procedure? No preparation is needed for this procedure. What happens during the procedure?   You will undress and remove any jewelry around your neck or chest.  You will put on a hospital gown.  Sticky electrodes will be placed on your chest. The electrodes will be connected to an electrocardiogram (ECG) machine to record a tracing of the electrical activity of your heart.  A CT scanner will take pictures of your heart. During this time, you will be asked to lie still and hold your breath for 2-3 seconds while a picture of your heart is being taken. The procedure may vary among health care providers and hospitals. What happens after the procedure?  You can get dressed.  You can return to your normal activities.  It is up to you to get the results of your test. Ask your health care provider, or the department that is doing the test, when your results will be ready. Summary  A coronary calcium scan is an imaging test used to look for deposits of calcium and other fatty materials (plaques) in the inner lining of the blood vessels of the heart (coronary arteries).  Generally,  this is a safe procedure. Tell your health care provider if you are pregnant or may be pregnant.  No preparation is needed for this procedure.  A CT scanner will take pictures of your heart.  You can return to your normal activities after the scan is done. This information is not intended to replace advice given to you by your health care provider. Make sure you discuss any  questions you have with your health care provider. Document Released: 06/19/2007 Document Revised: 11/10/2015 Document Reviewed: 11/10/2015 Elsevier Interactive Patient Education  2019 [employer].   Follow-Up: At [facility], you and your health needs are our priority.  As part of our continuing mission to provide you with exceptional heart care, we have created designated Provider Care Teams.  These Care Teams include your primary Cardiologist (physician) and Advanced Practice Providers (APPs -  Physician Assistants and Nurse Practitioners) who all work together to provide you with the care you need, when you need it. . You may schedule a follow up appointment AS NEEDED.  You may see Dr. Gwenlyn Found or one of the following Advanced Practice Providers on your designated Care Team:   . Kerin Ransom, [place] . Almyra Deforest, PA-C . Fabian Sharp, PA-C . Jory Sims, DNP . Rosaria Ferries, PA-C . Roby Lofts, PA-C . Sande Rives, PA-C

## 2018-02-14 NOTE — Assessment & Plan Note (Signed)
History of hyperlipidemia not on statin therapy but on red yeast rice lipid profile performed 08/26/2017 revealing total cholesterol 264, LDL 176 and HDL of 51.  Because of her chronic fatigue syndrome her PCP has been reticent to place her on a statin drug.

## 2018-02-14 NOTE — Progress Notes (Signed)
02/14/2018 [name]   04-05-1950  [id number]  Primary Physician Prince Solian, MD Primary Cardiologist: Lorretta Harp MD Lupe Carney, [place]  HPI:  Jean Scott is a 68 y.o. mildly overweight married Caucasian female with no children whose husband [name] is also a patient of mine.  She was referred by Dr. Dagmar Hait for cardiovascular valuation because of dyspnea.  Her cardiac risk factors are notable for untreated hyperlipidemia.  She is hypertensive today but normally she is not nor she had medicines for this.  She has never smoked.  There is no family history of heart disease.  She is never had a heart attack or stroke.  She denies chest pain.  She does have chronic fatigue syndrome, fibromyalgia and seizure disorder.  She had a 2D echocardiogram performed because of dyspnea and murmur 05/15/2015 which was essentially normal.  She complains of shortness of breath on minimal activity.   Current Meds  Medication Sig  . amphetamine-dextroamphetamine (ADDERALL) 20 MG tablet Take 20 mg in the morning and 10 mg at lunch  . anti-nausea (EMETROL) solution Take 10 mLs by mouth 2 (two) times daily.  . Cholecalciferol (VITAMIN D3) 2000 units TABS Take 2,000 Units by mouth daily.  . clonazePAM (KLONOPIN) 1 MG tablet Take .75 mg at bedtime and .25 mg in the morning  . conjugated estrogens (PREMARIN) vaginal cream Place 1 Applicatorful vaginally daily.  . Diphenhydramine-APAP, sleep, (EXCEDRIN PM) 38-500 MG TABS Take by mouth at bedtime.   Marland Kitchen estradiol (ESTRACE) 0.5 MG tablet Take 0.5 mg by mouth daily.  . fluticasone (FLONASE) 50 MCG/ACT nasal spray Place 1 spray into both nostrils daily.  . Ibuprofen 200 MG CAPS Take by mouth.  . levothyroxine (SYNTHROID, LEVOTHROID) 88 MCG tablet Take 88 mcg by mouth daily before breakfast.  . Melatonin 2.5 MG CAPS Take 2.5 mg by mouth.  . mometasone (ELOCON) 0.1 % cream   . Omega-3 Fatty Acids (FISH OIL PO) Take 2 capsules by mouth daily.  Marland Kitchen  omeprazole (PRILOSEC OTC) 20 MG tablet Take 20 mg by mouth as needed.  . polyethylene glycol powder (GLYCOLAX/MIRALAX) powder Take 17 g by mouth 2 (two) times daily.  . Probiotic Product (PROBIOTIC DAILY PO) Take 1 tablet by mouth daily.  . Red Yeast Rice Extract (RED YEAST RICE PO) Take 1 capsule by mouth daily.  . vancomycin (VANCOCIN HCL) 125 MG capsule QD for 7 days then QOD for 2 weeks   Current Facility-Administered Medications for the 02/14/18 encounter (Office Visit) with Lorretta Harp, MD  Medication  . 0.9 %  sodium chloride infusion     Allergies  Allergen Reactions  . Sulfamethoxazole-Trimethoprim Other (See Comments)    Anxiety, jittery  . Alprazolam Other (See Comments)    Get too groggy can't function  . Chlorthalidone     Constipation   . Ciprofloxacin     Pt gets dehydrated  . Epinephrine     Makes pt feel like they are coming out of their skin  . Potassium Citrate     Constipation; "CSF crash so I wasn't functioning well at all"    Social History   Socioeconomic History  . Marital status: Married    Spouse name: Not on file  . Number of children: 0  . Years of education: Not on file  . Highest education level: Not on file  Occupational History  . Occupation: Disabled  Social Needs  . Financial resource strain: Not on file  .  Food insecurity:    Worry: Not on file    Inability: Not on file  . Transportation needs:    Medical: Not on file    Non-medical: Not on file  Tobacco Use  . Smoking status: Never Smoker  . Smokeless tobacco: Never Used  Substance and Sexual Activity  . Alcohol use: No  . Drug use: No  . Sexual activity: Not on file  Lifestyle  . Physical activity:    Days per week: Not on file    Minutes per session: Not on file  . Stress: Not on file  Relationships  . Social connections:    Talks on phone: Not on file    Gets together: Not on file    Attends religious service: Not on file    Active member of club or  organization: Not on file    Attends meetings of clubs or organizations: Not on file    Relationship status: Not on file  . Intimate partner violence:    Fear of current or ex partner: Not on file    Emotionally abused: Not on file    Physically abused: Not on file    Forced sexual activity: Not on file  Other Topics Concern  . Not on file  Social History Narrative  . Not on file     Review of Systems: General: negative for chills, fever, night sweats or weight changes.  Cardiovascular: negative for chest pain, dyspnea on exertion, edema, orthopnea, palpitations, paroxysmal nocturnal dyspnea or shortness of breath Dermatological: negative for rash Respiratory: negative for cough or wheezing Urologic: negative for hematuria Abdominal: negative for nausea, vomiting, diarrhea, bright red blood per rectum, melena, or hematemesis Neurologic: negative for visual changes, syncope, or dizziness All other systems reviewed and are otherwise negative except as noted above.    Blood pressure (!) 162/98, pulse 78, height 5\' 4"  (1.626 m), weight 154 lb 9.6 oz (70.1 kg).  General appearance: alert and no distress Neck: no adenopathy, no carotid bruit, no JVD, supple, symmetrical, trachea midline and thyroid not enlarged, symmetric, no tenderness/mass/nodules Lungs: clear to auscultation bilaterally Heart: regular rate and rhythm, S1, S2 normal, no murmur, click, rub or gallop Extremities: extremities normal, atraumatic, no cyanosis or edema Pulses: 2+ and symmetric Skin: Skin color, texture, turgor normal. No rashes or lesions Neurologic: Alert and oriented X 3, normal strength and tone. Normal symmetric reflexes. Normal coordination and gait  EKG sinus rhythm at 78 with LVH voltage and nonspecific ST and T wave changes.  I personally reviewed this EKG.  ASSESSMENT AND PLAN:   Hyperlipidemia History of hyperlipidemia not on statin therapy but on red yeast rice lipid profile performed  08/26/2017 revealing total cholesterol 264, LDL 176 and HDL of 51.  Because of her chronic fatigue syndrome her PCP has been reticent to place her on a statin drug.  Dyspnea on exertion Ms. Chavero was referred to me by Dr. Dagmar Hait for evaluation of dyspnea on exertion.  He has never smoked.  There is a question of silent reflux.  She says that she gets dyspneic with minimal activity.  She apparently had a 2D echo performed for dyspnea and a murmur 05/15/2015 was unremarkable with normal LV function.  Am to repeat a 2D echo and get a coronary calcium score to further evaluate.      Lorretta Harp MD FACP,FACC,FAHA, [facility] 02/14/2018 3:46 PM

## 2018-02-14 NOTE — Assessment & Plan Note (Signed)
Jean Scott was referred to me by Dr. Dagmar Hait for evaluation of dyspnea on exertion.  He has never smoked.  There is a question of silent reflux.  She says that she gets dyspneic with minimal activity.  She apparently had a 2D echo performed for dyspnea and a murmur 05/15/2015 was unremarkable with normal LV function.  Am to repeat a 2D echo and get a coronary calcium score to further evaluate.

## 2018-03-07 ENCOUNTER — Ambulatory Visit (HOSPITAL_COMMUNITY): Payer: PPO | Attending: Cardiovascular Disease

## 2018-03-07 ENCOUNTER — Ambulatory Visit
Admission: RE | Admit: 2018-03-07 | Discharge: 2018-03-07 | Disposition: A | Discharge status: Home / Self Care | Payer: PPO | Source: Ambulatory Visit | Attending: Cardiovascular Disease | Admitting: Cardiovascular Disease

## 2018-03-07 DIAGNOSIS — R0602 Shortness of breath: Secondary | ICD-10-CM | POA: Insufficient documentation

## 2018-03-07 DIAGNOSIS — R0609 Other forms of dyspnea: Secondary | ICD-10-CM | POA: Diagnosis not present

## 2018-03-07 MED ORDER — PERFLUTREN LIPID MICROSPHERE
1.0000 mL | INTRAVENOUS | Status: AC | PRN
  Administered 2018-03-07: 2 mL via INTRAVENOUS

## 2018-03-09 ENCOUNTER — Telehealth: Payer: Self-pay

## 2018-03-09 DIAGNOSIS — I712 Thoracic aortic aneurysm, without rupture, unspecified: Secondary | ICD-10-CM

## 2018-03-09 DIAGNOSIS — I5189 Other ill-defined heart diseases: Secondary | ICD-10-CM

## 2018-03-09 NOTE — Telephone Encounter (Signed)
Orders for repeat echo in 12 months and thoracic CTA for thoracic aorta in Epic. Lab orders for BMP and CBC placed as well. Will route to scheduling

## 2018-03-09 NOTE — Telephone Encounter (Signed)
Letter including lab slip and recommended testing mailed to pt

## 2018-03-13 DIAGNOSIS — E038 Other specified hypothyroidism: Secondary | ICD-10-CM | POA: Diagnosis not present

## 2018-03-13 DIAGNOSIS — R7301 Impaired fasting glucose: Secondary | ICD-10-CM | POA: Diagnosis not present

## 2018-03-13 DIAGNOSIS — E7849 Other hyperlipidemia: Secondary | ICD-10-CM | POA: Diagnosis not present

## 2018-03-13 DIAGNOSIS — I519 Heart disease, unspecified: Secondary | ICD-10-CM | POA: Diagnosis not present

## 2018-03-13 DIAGNOSIS — Z6827 Body mass index (BMI) 27.0-27.9, adult: Secondary | ICD-10-CM | POA: Diagnosis not present

## 2018-03-13 DIAGNOSIS — I712 Thoracic aortic aneurysm, without rupture: Secondary | ICD-10-CM | POA: Diagnosis not present

## 2018-03-13 DIAGNOSIS — R5382 Chronic fatigue, unspecified: Secondary | ICD-10-CM | POA: Diagnosis not present

## 2018-03-14 DIAGNOSIS — Z01419 Encounter for gynecological examination (general) (routine) without abnormal findings: Secondary | ICD-10-CM | POA: Diagnosis not present

## 2018-03-14 DIAGNOSIS — Z1231 Encounter for screening mammogram for malignant neoplasm of breast: Secondary | ICD-10-CM | POA: Diagnosis not present

## 2018-03-14 DIAGNOSIS — Z6826 Body mass index (BMI) 26.0-26.9, adult: Secondary | ICD-10-CM | POA: Diagnosis not present

## 2018-03-15 DIAGNOSIS — N952 Postmenopausal atrophic vaginitis: Secondary | ICD-10-CM | POA: Diagnosis not present

## 2018-03-15 DIAGNOSIS — N3020 Other chronic cystitis without hematuria: Secondary | ICD-10-CM | POA: Diagnosis not present

## 2018-03-15 DIAGNOSIS — N200 Calculus of kidney: Secondary | ICD-10-CM | POA: Diagnosis not present

## 2018-03-16 ENCOUNTER — Other Ambulatory Visit: Payer: Self-pay | Admitting: Obstetrics & Gynecology

## 2018-03-16 DIAGNOSIS — R928 Other abnormal and inconclusive findings on diagnostic imaging of breast: Secondary | ICD-10-CM

## 2018-03-17 ENCOUNTER — Ambulatory Visit
Admission: RE | Admit: 2018-03-17 | Discharge: 2018-03-17 | Disposition: A | Discharge status: Home / Self Care | Payer: PPO | Source: Ambulatory Visit | Attending: Obstetrics & Gynecology | Admitting: Obstetrics & Gynecology

## 2018-03-17 ENCOUNTER — Other Ambulatory Visit: Payer: Self-pay

## 2018-03-17 DIAGNOSIS — R928 Other abnormal and inconclusive findings on diagnostic imaging of breast: Secondary | ICD-10-CM

## 2018-03-17 DIAGNOSIS — R922 Inconclusive mammogram: Secondary | ICD-10-CM | POA: Diagnosis not present

## 2018-03-17 DIAGNOSIS — N6001 Solitary cyst of right breast: Secondary | ICD-10-CM | POA: Diagnosis not present

## 2018-03-20 NOTE — Telephone Encounter (Signed)
That was the computer EKG interp but I thought it was a NL variant and 2D echo was nl as well. No concern regarding her EKG

## 2018-03-30 ENCOUNTER — Telehealth: Payer: Self-pay | Admitting: *Deleted

## 2018-03-30 NOTE — Telephone Encounter (Signed)
-----   Message from Osvaldo Shipper, [place] sent at 03/29/2018  2:12 PM EDT ----- Regarding: CT and Medication Question I called pt to CX CT per Dr Gwenlyn Found per COVID Restrictions pt is still wanting CT regardless of restrictions also she is wanting to know if she can take statins?  Please call pt.  If you have any questions yo can call me [phone]  Thanks  Erline Levine

## 2018-03-30 NOTE — Telephone Encounter (Signed)
Spoke with patient regarding scan. She stated she did get mychart messages however she would like to get CT rescheduled since this was what Dr Gwenlyn Found originally wanted. Will cancel CT and send to schedulers to reschedule after the COVID 19 situation improves.

## 2018-03-30 NOTE — Telephone Encounter (Signed)
Spoke with pt, she had a cardiac CT scan that showed a thoracic aneurysm of 43 mm and she feels she needs to have the CT scan done right away. Also when they called her regarding the cardiac CTA she was questioned about taking a statin and she wants to know if she needs to take one. Will forward to dr berry to review and advise.

## 2018-03-30 NOTE — Telephone Encounter (Signed)
Encounter closed

## 2018-03-30 NOTE — Telephone Encounter (Signed)
Dr berry's response sent to patient thru my chart.

## 2018-03-30 NOTE — Telephone Encounter (Signed)
-----   Message from Stephani Police, RN sent at 03/29/2018  2:21 PM EDT ----- Regarding: FW: CT and Medication Question  ----- Message ----- From: Osvaldo Shipper, [place] Sent: 03/29/2018   2:12 PM EDT To: [facility] Triage Subject: CT and Medication Question                     I called pt to CX CT per Dr Gwenlyn Found per COVID Restrictions pt is still wanting CT regardless of restrictions also she is wanting to know if she can take statins?  Please call pt.  If you have any questions yo can call me [phone]  Thanks  Erline Levine

## 2018-03-30 NOTE — Telephone Encounter (Signed)
Her Cor CA score was 7, low. Her thoracic Ao measured 43 mm (40 mm by 2D echo). This is still relatively small and can safely be followed up in 12 months. With an LDL of 150 I think she would benefit from low dose statin but I can understand why she wasn't started on one with her fibromyalgia. We could start crestor 5 mg QOD and recheck in 2-3 months (Co Q 10 200 mg as well)

## 2018-04-03 ENCOUNTER — Inpatient Hospital Stay: Admission: RE | Admit: 2018-04-03 | Payer: PPO | Source: Ambulatory Visit

## 2018-04-13 DIAGNOSIS — J329 Chronic sinusitis, unspecified: Secondary | ICD-10-CM | POA: Diagnosis not present

## 2018-04-13 DIAGNOSIS — M5136 Other intervertebral disc degeneration, lumbar region: Secondary | ICD-10-CM | POA: Diagnosis not present

## 2018-04-13 DIAGNOSIS — F329 Major depressive disorder, single episode, unspecified: Secondary | ICD-10-CM | POA: Diagnosis not present

## 2018-04-13 DIAGNOSIS — C9100 Acute lymphoblastic leukemia not having achieved remission: Secondary | ICD-10-CM | POA: Diagnosis not present

## 2018-04-13 DIAGNOSIS — M810 Age-related osteoporosis without current pathological fracture: Secondary | ICD-10-CM | POA: Diagnosis not present

## 2018-04-13 DIAGNOSIS — J309 Allergic rhinitis, unspecified: Secondary | ICD-10-CM | POA: Diagnosis not present

## 2018-05-31 DIAGNOSIS — I712 Thoracic aortic aneurysm, without rupture: Secondary | ICD-10-CM | POA: Diagnosis not present

## 2018-06-01 ENCOUNTER — Other Ambulatory Visit: Payer: PPO

## 2018-06-01 LAB — BASIC METABOLIC PANEL
BUN/Creatinine Ratio: 17 (ref 12–28)
BUN: 15 mg/dL (ref 8–27)
CO2: 22 mmol/L (ref 20–29)
Calcium: 11.1 mg/dL — ABNORMAL HIGH (ref 8.7–10.3)
Chloride: 99 mmol/L (ref 96–106)
Creatinine, Ser: 0.87 mg/dL (ref 0.57–1.00)
GFR calc Af Amer: 79 mL/min/{1.73_m2} (ref >59)
GFR calc non Af Amer: 69 mL/min/{1.73_m2} (ref >59)
Glucose: 114 mg/dL — ABNORMAL HIGH (ref 65–99)
Potassium: 4.4 mmol/L (ref 3.5–5.2)
Sodium: 140 mmol/L (ref 134–144)

## 2018-06-01 LAB — CBC
Hematocrit: 43.8 % (ref 34.0–46.6)
Hemoglobin: 15 g/dL (ref 11.1–15.9)
MCH: 29.9 pg (ref 26.6–33.0)
MCHC: 34.2 g/dL (ref 31.5–35.7)
MCV: 87 fL (ref 79–97)
Platelets: 289 10*3/uL (ref 150–450)
RBC: 5.02 x10E6/uL (ref 3.77–5.28)
RDW: 12.9 % (ref 11.7–15.4)
WBC: 7.6 10*3/uL (ref 3.4–10.8)

## 2018-06-06 ENCOUNTER — Telehealth: Payer: Self-pay | Admitting: *Deleted

## 2018-06-06 NOTE — Telephone Encounter (Signed)

## 2018-06-07 ENCOUNTER — Ambulatory Visit (INDEPENDENT_AMBULATORY_CARE_PROVIDER_SITE_OTHER)
Admission: RE | Admit: 2018-06-07 | Discharge: 2018-06-07 | Disposition: A | Discharge status: Home / Self Care | Payer: PPO | Source: Ambulatory Visit | Attending: Cardiovascular Disease | Admitting: Cardiovascular Disease

## 2018-06-07 ENCOUNTER — Other Ambulatory Visit: Payer: Self-pay

## 2018-06-07 DIAGNOSIS — I712 Thoracic aortic aneurysm, without rupture, unspecified: Secondary | ICD-10-CM

## 2018-06-07 MED ORDER — IOHEXOL 350 MG/ML SOLN
100.0000 mL | Freq: Once | INTRAVENOUS | Status: AC | PRN
  Administered 2018-06-07: 100 mL via INTRAVENOUS

## 2018-06-15 DIAGNOSIS — D485 Neoplasm of uncertain behavior of skin: Secondary | ICD-10-CM | POA: Diagnosis not present

## 2018-06-15 DIAGNOSIS — L718 Other rosacea: Secondary | ICD-10-CM | POA: Diagnosis not present

## 2018-06-15 DIAGNOSIS — L814 Other melanin hyperpigmentation: Secondary | ICD-10-CM | POA: Diagnosis not present

## 2018-06-15 DIAGNOSIS — L821 Other seborrheic keratosis: Secondary | ICD-10-CM | POA: Diagnosis not present

## 2018-06-15 DIAGNOSIS — L570 Actinic keratosis: Secondary | ICD-10-CM | POA: Diagnosis not present

## 2018-06-15 DIAGNOSIS — D1801 Hemangioma of skin and subcutaneous tissue: Secondary | ICD-10-CM | POA: Diagnosis not present

## 2018-09-05 ENCOUNTER — Other Ambulatory Visit: Payer: Self-pay

## 2018-09-05 ENCOUNTER — Encounter: Payer: Self-pay | Admitting: Physician Assistant

## 2018-09-05 ENCOUNTER — Ambulatory Visit (INDEPENDENT_AMBULATORY_CARE_PROVIDER_SITE_OTHER): Payer: PPO | Admitting: Physician Assistant

## 2018-09-05 ENCOUNTER — Telehealth: Payer: PPO | Admitting: Cardiology

## 2018-09-05 VITALS — BP 164/80 | HR 83 | Temp 96.6°F | Ht 64.0 in | Wt 157.1 lb

## 2018-09-05 DIAGNOSIS — E039 Hypothyroidism, unspecified: Secondary | ICD-10-CM | POA: Diagnosis not present

## 2018-09-05 DIAGNOSIS — R0789 Other chest pain: Secondary | ICD-10-CM | POA: Diagnosis not present

## 2018-09-05 DIAGNOSIS — I712 Thoracic aortic aneurysm, without rupture, unspecified: Secondary | ICD-10-CM

## 2018-09-05 DIAGNOSIS — I10 Essential (primary) hypertension: Secondary | ICD-10-CM | POA: Diagnosis not present

## 2018-09-05 DIAGNOSIS — R0609 Other forms of dyspnea: Secondary | ICD-10-CM | POA: Diagnosis not present

## 2018-09-05 DIAGNOSIS — R079 Chest pain, unspecified: Secondary | ICD-10-CM

## 2018-09-05 DIAGNOSIS — E785 Hyperlipidemia, unspecified: Secondary | ICD-10-CM

## 2018-09-05 MED ORDER — DILTIAZEM HCL 30 MG PO TABS: 30.0000 mg | ORAL_TABLET | Freq: Three times a day (TID) | ORAL | 3 refills | Status: DC

## 2018-09-05 NOTE — Patient Instructions (Signed)
Medication Instructions:   START Diltiazem 30 mg 3 times a day If you need a refill on your cardiac medications before your next appointment, please call your pharmacy.   Lab work: NONE ordered at this time of appointment   If you have labs (blood work) drawn today and your tests are completely normal, you will receive your results only by: Marland Kitchen MyChart Message (if you have MyChart) OR . A paper copy in the mail If you have any lab test that is abnormal or we need to change your treatment, we will call you to review the results.  Testing/Procedures: NONE ordered at this time of appointment   Follow-Up: At [facility], you and your health needs are our priority.  As part of our continuing mission to provide you with exceptional heart care, we have created designated Provider Care Teams.  These Care Teams include your primary Cardiologist (physician) and Advanced Practice Providers (APPs -  Physician Assistants and Nurse Practitioners) who all work together to provide you with the care you need, when you need it. . You will need a follow up appointment in 1-2 months with Quay Burow, MD only  Any Other Special Instructions Will Be Listed Below (If Applicable).

## 2018-09-05 NOTE — Progress Notes (Signed)
Cardiology Office Note    Date:  09/06/2018   ID:  Jean Scott, DOB 02/03/1950, MRN [id number]  PCP:  Prince Solian, MD  Cardiologist:  Dr. Gwenlyn Found  Chief Complaint  Patient presents with   Other    Patient c/o increased SOB and chest tightness. Meds reviewed verbally with patient.     History of Present Illness:  Jean Scott is a 68 y.o. female with past medical history of fibromyalgia, hyperlipidemia, hypothyroidism who was referred to cardiology service for evaluation of dyspnea in February 2020.  She has no family history of heart disease and never smoked cigarettes.  2D echocardiogram obtained for heart murmur in May 2017 was normal.  Echocardiogram obtained on 03/07/2018 showed EF 55 to 60%, mild AI, mild dilatation of the aortic root measuring 40 mm.  Coronary calcium scoring obtained on the same day showed coronary calcium score of 7, this place the patient at 57 percentile for age and sex matched control, ascending aortic aneurysm measuring at 43 mm.  CT angiogram of the chest obtained on 06/07/2018 showed ascending thoracic aorta measuring at 4.0 x 3.9 cm, normal caliber of the descending thoracic aorta.  Patient presents today for complaint of worsening dyspnea on exertion.  She also has very mild chest tightness for the past 4 days.  She appears to be quite anxious and feel very fatigued for the past 6 months.  She says that she feels bad that she could not go out.  She did denies any resting dyspnea, lower extremity edema, orthopnea or PND.  Her lung exam was normal.  She does not have significant swelling on exam to suggest heart failure.  On physical exam, she had premature beat on every third beat.  EKG demonstrated PACs however no significant ischemic changes.  I recommended a short acting diltiazem at 3 times daily dosing to help with her blood pressure and heart rate.  Repeat manual check by myself also demonstrated elevated blood pressure in the 160s.  With thoracic aortic  aneurysm, we need to aggressively control her blood pressure.  As far as her worsening shortness of breath, fatigue and atypical chest tightness, I discussed the case with DOD Dr. Debara Pickett, our suspicion for significant coronary artery disease is fairly low given the low coronary calcium score.  We did discuss potential possibility of cardiopulmonary stress test, Lexiscan stress test versus coronary CT.  Unfortunately cardiopulmonary stress test will be a aerosolizing procedure and unlikely to be completed at this time.  We recommended medical management for the time being and reassessment in 1 to 2 months.   Past Medical History:  Diagnosis Date   Allergic rhinitis    Dust mites, mold, plant polens   Anemia 1970's   Arm fracture 1957   Depression 1991   6 months   Elevated uric acid in blood    Fibroids    Fibromyalgia 1990   Fibrosis, breast    Mammogram   Glucose intolerance (impaired glucose tolerance)    Grand mal seizure ([place]) 1960   Hemorrhoids, internal [id number]   History of hypercalcemia    Hyperlipidemia    Hypothyroidism 1990   Osteopenia 2012   Bone density Test   Seizures [facility])    Urinary tract infection     Past Surgical History:  Procedure Laterality Date   ABDOMINAL HYSTERECTOMY  1989   open surgery for severe endometriosis but did initially have laproscopic   cataracts bilat     10/24/14 10/31/2014  CYSTOSCOPY     stent removal-[facility]-Jan [id number]   Laparotomy  1987   URETEROSCOPY     Jan 10,2017    Current Medications: Outpatient Medications Prior to Visit  Medication Sig Dispense Refill   amphetamine-dextroamphetamine (ADDERALL) 20 MG tablet Take 20 mg in the morning and 10 mg at lunch     anti-nausea (EMETROL) solution Take 10 mLs by mouth 2 (two) times daily. 118 mL 0   Cholecalciferol (VITAMIN D3) 2000 units TABS Take 2,000 Units by mouth daily. 30 tablet    clonazePAM (KLONOPIN) 1 MG tablet Take .75 mg at bedtime and .25 mg in  the morning 30 tablet    conjugated estrogens (PREMARIN) vaginal cream Place 1 Applicatorful vaginally daily.     Diphenhydramine-APAP, sleep, (EXCEDRIN PM) 38-500 MG TABS Take by mouth at bedtime.      doxycycline (VIBRA-TABS) 100 MG tablet TK 1 T PO BID     fluticasone (FLONASE) 50 MCG/ACT nasal spray Place 1 spray into both nostrils daily.  2   Ibuprofen 200 MG CAPS Take by mouth.     levothyroxine (SYNTHROID, LEVOTHROID) 88 MCG tablet Take 88 mcg by mouth daily before breakfast.     Melatonin 2.5 MG CAPS Take 2.5 mg by mouth.     mometasone (ELOCON) 0.1 % cream   2   Omega-3 Fatty Acids (FISH OIL PO) Take 2 capsules by mouth daily.     omeprazole (PRILOSEC OTC) 20 MG tablet Take 20 mg by mouth as needed.     polyethylene glycol powder (GLYCOLAX/MIRALAX) powder Take 17 g by mouth 2 (two) times daily. 255 g 3   Probiotic Product (PROBIOTIC DAILY PO) Take 1 tablet by mouth daily.     Red Yeast Rice Extract (RED YEAST RICE PO) Take 1 capsule by mouth daily.     estradiol (ESTRACE) 0.5 MG tablet Take 0.5 mg by mouth daily.     metroNIDAZOLE (METROCREAM) 0.75 % cream APP ONE APPLICATION THIN LAYER TO FACE BID     vancomycin (VANCOCIN HCL) 125 MG capsule QD for 7 days then QOD for 2 weeks (Patient not taking: Reported on 09/05/2018) 30 capsule 0   Facility-Administered Medications Prior to Visit  Medication Dose Route Frequency Provider Last Rate Last Dose   0.9 %  sodium chloride infusion  500 mL Intravenous Continuous Ladene Artist, MD         Allergies:   Sulfamethoxazole-trimethoprim, Alprazolam, Chlorthalidone, Ciprofloxacin, Epinephrine, and Potassium citrate   Social History   Socioeconomic History   Marital status: Married    Spouse name: Not on file   Number of children: 0   Years of education: Not on file   Highest education level: Not on file  Occupational History   Occupation: Disabled  Scientist, product/process development strain: Not on file   Food  insecurity    Worry: Not on file    Inability: Not on file   Transportation needs    Medical: Not on file    Non-medical: Not on file  Tobacco Use   Smoking status: Never Smoker   Smokeless tobacco: Never Used  Substance and Sexual Activity   Alcohol use: No   Drug use: No   Sexual activity: Not on file  Lifestyle   Physical activity    Days per week: Not on file    Minutes per session: Not on file   Stress: Not on file  Relationships   Social connections    Talks on phone:  Not on file    Gets together: Not on file    Attends religious service: Not on file    Active member of club or organization: Not on file    Attends meetings of clubs or organizations: Not on file    Relationship status: Not on file  Other Topics Concern   Not on file  Social History Narrative   Not on file     Family History:  The patient's family history includes Arthritis in her brother; Breast cancer in her sister; Gout in her brother; Hypertension in her mother; Kidney Stones in her brother; Osteoporosis in her mother; Pancreatic cancer in her paternal aunt; Parkinson's disease in her father.   ROS:   Please see the history of present illness.    ROS All other systems reviewed and are negative.   PHYSICAL EXAM:   VS:  BP (!) 164/80 (BP Location: Left Arm, Patient Position: Sitting, Cuff Size: Normal)    Pulse 83    Temp (!) 96.6 F (35.9 C)    Ht 5\' 4"  (1.626 m)    Wt 157 lb 2 oz (71.3 kg)    SpO2 98%    BMI 26.97 kg/m    GEN: Well nourished, well developed, in no acute distress  HEENT: normal  Neck: no JVD, carotid bruits, or masses Cardiac: RRR; no murmurs, rubs, or gallops,no edema  Respiratory:  clear to auscultation bilaterally, normal work of breathing GI: soft, nontender, nondistended, + BS MS: no deformity or atrophy  Skin: warm and dry, no rash Neuro:  Alert and Oriented x 3, Strength and sensation are intact Psych: euthymic mood, full affect  Wt Readings from Last 3  Encounters:  09/05/18 157 lb 2 oz (71.3 kg)  02/14/18 154 lb 9.6 oz (70.1 kg)  12/08/16 153 lb 8 oz (69.6 kg)      Studies/Labs Reviewed:   EKG:  EKG is ordered today.  The ekg ordered today demonstrates normal sinus rhythm with PACs  Recent Labs: 05/31/2018: BUN 15; Creatinine, Ser 0.87; Hemoglobin 15.0; Platelets 289; Potassium 4.4; Sodium 140   Lipid Panel No results found for: CHOL, TRIG, HDL, CHOLHDL, VLDL, LDLCALC, LDLDIRECT  Additional studies/ records that were reviewed today include:   Echo 03/07/2018  1. The left ventricle has normal systolic function, with an ejection fraction of 55-60%. The cavity size was normal. Left ventricular diastolic Doppler parameters are consistent with impaired relaxation.  2. The right ventricle has normal systolic function. The cavity was normal. There is no increase in right ventricular wall thickness. Right ventricular systolic pressure is normal with an estimated pressure of 24.9 mmHg.  3. The mitral valve is normal in structure.  4. The tricuspid valve is normal in structure.  5. The aortic valve is tricuspid Aortic valve regurgitation is mild by color flow Doppler. The jet is centrally-directed.  6. The pulmonic valve was normal in structure.  7. There is mild dilatation of the aortic root measuring 40 mm.   Coronary calcium 03/07/2018 IMPRESSION: 1. Coronary calcium score of 7. This was 68 percentile for age and sex matched control.  2. Ascending aortic aneurysm measuring 43 mm in the axial views. A chest CTA or MRA is recommended for further evaluation.   CTA of chest 06/07/2018  IMPRESSION: The tubular ascending thoracic aorta is enlarged to 4.0 x 3.9 cm. Although better evaluated on recent gated CT coronary angiogram, the aortic valve measures approximately 2.0 cm and the sinuses of Valsalva measure approximately 3.8 cm.  Normal caliber of the descending thoracic aorta measuring 2.0 x 2.0 cm.   ASSESSMENT:    1. Chest pain  of uncertain etiology   2. Hyperlipidemia, unspecified hyperlipidemia type   3. Hypothyroidism, unspecified type   4. Thoracic aortic aneurysm without rupture ([place])   5. DOE (dyspnea on exertion)   6. Essential hypertension      PLAN:  In order of problems listed above:  1. Chest discomfort: Symptoms are fairly atypical.  Previous coronary calcium score was 7.  I reviewed the case with DOD Dr. Debara Pickett, at this time we recommend continue observation.  On EKG, she has frequent PACs which may cause her chest discomfort.  I recommended short acting diltiazem for suppression of PACs.  I did not choose beta-blocker because she has chronic fatigue.  2. Dyspnea on exertion: Ideally she would need a cardiopulmonary stress test, however this is unable to be done at this time.  I recommended medical therapy for the time being with diltiazem to try to suppress her PACs.  We will reassess her symptom and follow-up.  3. Hypertension: Addition of diltiazem as mentioned above  4. Hyperlipidemia: On red yeast rice and omega-3 fatty acid.  5. Hypothyroidism: Managed by primary care provider  6. Thoracic aortic aneurysm: About 4 cm.  Continue annual imaging    Medication Adjustments/Labs and Tests Ordered: Current medicines are reviewed at length with the patient today.  Concerns regarding medicines are outlined above.  Medication changes, Labs and Tests ordered today are listed in the Patient Instructions below. Patient Instructions  Medication Instructions:   START Diltiazem 30 mg 3 times a day If you need a refill on your cardiac medications before your next appointment, please call your pharmacy.   Lab work: NONE ordered at this time of appointment   If you have labs (blood work) drawn today and your tests are completely normal, you will receive your results only by:  [place] (if you have MyChart) OR  A paper copy in the mail If you have any lab test that is abnormal or we need to  change your treatment, we will call you to review the results.  Testing/Procedures: NONE ordered at this time of appointment   Follow-Up: At [facility], you and your health needs are our priority.  As part of our continuing mission to provide you with exceptional heart care, we have created designated Provider Care Teams.  These Care Teams include your primary Cardiologist (physician) and Advanced Practice Providers (APPs -  Physician Assistants and Nurse Practitioners) who all work together to provide you with the care you need, when you need it.  You will need a follow up appointment in 1-2 months with Quay Burow, MD only  Any Other Special Instructions Will Be Listed Below (If Applicable).       Hilbert Corrigan, [place]  09/06/2018 11:58 PM    [facility] [address], [place], [place]  [address] Phone: [phone]; Fax: [phone]

## 2018-09-12 DIAGNOSIS — L218 Other seborrheic dermatitis: Secondary | ICD-10-CM | POA: Diagnosis not present

## 2018-09-13 DIAGNOSIS — Q631 Lobulated, fused and horseshoe kidney: Secondary | ICD-10-CM | POA: Diagnosis not present

## 2018-09-13 DIAGNOSIS — N952 Postmenopausal atrophic vaginitis: Secondary | ICD-10-CM | POA: Diagnosis not present

## 2018-09-13 DIAGNOSIS — N200 Calculus of kidney: Secondary | ICD-10-CM | POA: Diagnosis not present

## 2018-09-13 DIAGNOSIS — N3020 Other chronic cystitis without hematuria: Secondary | ICD-10-CM | POA: Diagnosis not present

## 2018-09-25 DIAGNOSIS — M859 Disorder of bone density and structure, unspecified: Secondary | ICD-10-CM | POA: Diagnosis not present

## 2018-09-25 DIAGNOSIS — E038 Other specified hypothyroidism: Secondary | ICD-10-CM | POA: Diagnosis not present

## 2018-09-25 DIAGNOSIS — R7301 Impaired fasting glucose: Secondary | ICD-10-CM | POA: Diagnosis not present

## 2018-09-25 DIAGNOSIS — E7849 Other hyperlipidemia: Secondary | ICD-10-CM | POA: Diagnosis not present

## 2018-09-29 DIAGNOSIS — R82998 Other abnormal findings in urine: Secondary | ICD-10-CM | POA: Diagnosis not present

## 2018-10-02 DIAGNOSIS — E785 Hyperlipidemia, unspecified: Secondary | ICD-10-CM | POA: Diagnosis not present

## 2018-10-02 DIAGNOSIS — R569 Unspecified convulsions: Secondary | ICD-10-CM | POA: Diagnosis not present

## 2018-10-02 DIAGNOSIS — N200 Calculus of kidney: Secondary | ICD-10-CM | POA: Diagnosis not present

## 2018-10-02 DIAGNOSIS — Z79899 Other long term (current) drug therapy: Secondary | ICD-10-CM | POA: Diagnosis not present

## 2018-10-02 DIAGNOSIS — R5382 Chronic fatigue, unspecified: Secondary | ICD-10-CM | POA: Diagnosis not present

## 2018-10-02 DIAGNOSIS — R7301 Impaired fasting glucose: Secondary | ICD-10-CM | POA: Diagnosis not present

## 2018-10-02 DIAGNOSIS — Z0000 Encounter for general adult medical examination without abnormal findings: Secondary | ICD-10-CM | POA: Diagnosis not present

## 2018-10-02 DIAGNOSIS — M8580 Other specified disorders of bone density and structure, unspecified site: Secondary | ICD-10-CM | POA: Diagnosis not present

## 2018-10-02 DIAGNOSIS — Z1331 Encounter for screening for depression: Secondary | ICD-10-CM | POA: Diagnosis not present

## 2018-10-02 DIAGNOSIS — I712 Thoracic aortic aneurysm, without rupture: Secondary | ICD-10-CM | POA: Diagnosis not present

## 2018-10-02 DIAGNOSIS — I519 Heart disease, unspecified: Secondary | ICD-10-CM | POA: Diagnosis not present

## 2018-10-02 DIAGNOSIS — E039 Hypothyroidism, unspecified: Secondary | ICD-10-CM | POA: Diagnosis not present

## 2018-10-13 DIAGNOSIS — Z1212 Encounter for screening for malignant neoplasm of rectum: Secondary | ICD-10-CM | POA: Diagnosis not present

## 2018-10-17 ENCOUNTER — Other Ambulatory Visit: Payer: Self-pay

## 2018-10-17 ENCOUNTER — Ambulatory Visit: Payer: PPO | Admitting: Cardiovascular Disease

## 2018-10-17 ENCOUNTER — Encounter: Payer: Self-pay | Admitting: Cardiovascular Disease

## 2018-10-17 DIAGNOSIS — I712 Thoracic aortic aneurysm, without rupture, unspecified: Secondary | ICD-10-CM

## 2018-10-17 DIAGNOSIS — I10 Essential (primary) hypertension: Secondary | ICD-10-CM

## 2018-10-17 DIAGNOSIS — R0600 Dyspnea, unspecified: Secondary | ICD-10-CM

## 2018-10-17 DIAGNOSIS — R0609 Other forms of dyspnea: Secondary | ICD-10-CM

## 2018-10-17 DIAGNOSIS — E782 Mixed hyperlipidemia: Secondary | ICD-10-CM

## 2018-10-17 MED ORDER — DILTIAZEM HCL ER COATED BEADS 180 MG PO CP24: 180.0000 mg | ORAL_CAPSULE | Freq: Every day | ORAL | 3 refills | Status: DC

## 2018-10-17 NOTE — Assessment & Plan Note (Signed)
History of dyspnea on exertion with a 2D echo performed 03/07/2018 revealing normal LV systolic function and diastolic dysfunction.  She does not have a history of tobacco abuse.  The coronary calcium score is low at 7.  I am going to get a Lexiscan Myoview stress test to rule out an ischemic etiology.

## 2018-10-17 NOTE — Assessment & Plan Note (Signed)
History of hyperlipidemia not on statin therapy because of history of fibromyalgia with lipid profile performed 09/25/2018 revealing total cholesterol 252, LDL 172 and HDL 46.  She is a good candidate for Repatha

## 2018-10-17 NOTE — Progress Notes (Signed)
10/17/2018 [name]   Oct 29, 1950  [id number]  Primary Physician Prince Solian, MD Primary Cardiologist: Lorretta Harp MD Lupe Carney, [place]  HPI:  Jean Scott is a 68 y.o.  mildly overweight married Caucasian female with no children whose husband [name] is also a patient of mine.  She was referred by Dr. Dagmar Hait for cardiovascular valuation because of dyspnea.    I last saw her in the office 02/14/2018.  Her cardiac risk factors are notable for untreated hyperlipidemia.  She is hypertensive today but normally she is not nor she had medicines for this.  She has never smoked.  There is no family history of heart disease.  She is never had a heart attack or stroke.  She denies chest pain.  She does have chronic fatigue syndrome, fibromyalgia and seizure disorder.  She had a 2D echocardiogram performed because of dyspnea and murmur 05/15/2015 which was essentially normal.  She complains of shortness of breath on minimal activity.  Since I saw her 8 months ago she did have a 2D echo 03/07/2018 which was essentially normal except for some diastolic dysfunction and a thoracic aorta which was generous.  Follow-up chest CTA performed 06/07/2018 revealed this to be about 4 cm.  The coronary calcium score is measured at 7 making it unlikely that she has CAD although she continues to complain of dyspnea without a clear etiology.  In addition, she has recently become hypertensive and was put on short acting diltiazem with a blood pressure log at home showing borderline hypertensive readings.  Her lipid profile has been significantly elevated as well.  Current Meds  Medication Sig  . amphetamine-dextroamphetamine (ADDERALL) 20 MG tablet Take 20 mg in the morning and 10 mg at lunch  . anti-nausea (EMETROL) solution Take 10 mLs by mouth 2 (two) times daily.  . Cholecalciferol (VITAMIN D3) 2000 units TABS Take 2,000 Units by mouth daily.  . clonazePAM (KLONOPIN) 1 MG tablet Take .75 mg at bedtime and  .25 mg in the morning  . conjugated estrogens (PREMARIN) vaginal cream Place 1 Applicatorful vaginally daily.  . Diphenhydramine-APAP, sleep, (EXCEDRIN PM) 38-500 MG TABS Take by mouth at bedtime.   . fluticasone (FLONASE) 50 MCG/ACT nasal spray Place 1 spray into both nostrils daily.  . Ibuprofen 200 MG CAPS Take by mouth.  . levothyroxine (SYNTHROID, LEVOTHROID) 88 MCG tablet Take 88 mcg by mouth daily before breakfast.  . Melatonin 2.5 MG CAPS Take 2.5 mg by mouth.  . mometasone (ELOCON) 0.1 % cream   . Omega-3 Fatty Acids (FISH OIL PO) Take 2 capsules by mouth daily.  Marland Kitchen omeprazole (PRILOSEC OTC) 20 MG tablet Take 20 mg by mouth as needed.  . Probiotic Product (PROBIOTIC DAILY PO) Take 1 tablet by mouth daily.  . Red Yeast Rice Extract (RED YEAST RICE PO) Take 1 capsule by mouth daily.   Current Facility-Administered Medications for the 10/17/18 encounter (Office Visit) with Lorretta Harp, MD  Medication  . 0.9 %  sodium chloride infusion     Allergies  Allergen Reactions  . Sulfamethoxazole-Trimethoprim Other (See Comments)    Anxiety, jittery  . Alprazolam Other (See Comments)    Get too groggy can't function  . Chlorthalidone     Constipation   . Ciprofloxacin     Pt gets dehydrated  . Epinephrine     Makes pt feel like they are coming out of their skin  . Potassium Citrate     Constipation; "  CSF crash so I wasn't functioning well at all"    Social History   Socioeconomic History  . Marital status: Married    Spouse name: Not on file  . Number of children: 0  . Years of education: Not on file  . Highest education level: Not on file  Occupational History  . Occupation: Disabled  Social Needs  . Financial resource strain: Not on file  . Food insecurity    Worry: Not on file    Inability: Not on file  . Transportation needs    Medical: Not on file    Non-medical: Not on file  Tobacco Use  . Smoking status: Never Smoker  . Smokeless tobacco: Never Used   Substance and Sexual Activity  . Alcohol use: No  . Drug use: No  . Sexual activity: Not on file  Lifestyle  . Physical activity    Days per week: Not on file    Minutes per session: Not on file  . Stress: Not on file  Relationships  . Social Herbalist on phone: Not on file    Gets together: Not on file    Attends religious service: Not on file    Active member of club or organization: Not on file    Attends meetings of clubs or organizations: Not on file    Relationship status: Not on file  . Intimate partner violence    Fear of current or ex partner: Not on file    Emotionally abused: Not on file    Physically abused: Not on file    Forced sexual activity: Not on file  Other Topics Concern  . Not on file  Social History Narrative  . Not on file     Review of Systems: General: negative for chills, fever, night sweats or weight changes.  Cardiovascular: negative for chest pain, dyspnea on exertion, edema, orthopnea, palpitations, paroxysmal nocturnal dyspnea or shortness of breath Dermatological: negative for rash Respiratory: negative for cough or wheezing Urologic: negative for hematuria Abdominal: negative for nausea, vomiting, diarrhea, bright red blood per rectum, melena, or hematemesis Neurologic: negative for visual changes, syncope, or dizziness All other systems reviewed and are otherwise negative except as noted above.    Blood pressure (!) 164/92, pulse 72, height 5\' 4"  (1.626 m), weight 157 lb 12.8 oz (71.6 kg), SpO2 98 %.  General appearance: alert and no distress Neck: no adenopathy, no carotid bruit, no JVD, supple, symmetrical, trachea midline and thyroid not enlarged, symmetric, no tenderness/mass/nodules Lungs: clear to auscultation bilaterally Heart: regular rate and rhythm, S1, S2 normal, no murmur, click, rub or gallop Extremities: extremities normal, atraumatic, no cyanosis or edema Pulses: 2+ and symmetric Skin: Skin color, texture,  turgor normal. No rashes or lesions Neurologic: Alert and oriented X 3, normal strength and tone. Normal symmetric reflexes. Normal coordination and gait  EKG not performed today  ASSESSMENT AND PLAN:   Essential hypertension Recent onset of essential hypertension with blood pressure readings at home on the high side despite being on 3 times daily short acting diltiazem 30 mg a day.  I am going to change her to Cardizem CD 180 mg a day and will have her keep a blood pressure log for 30 days.  We will make an appointment for her to see Cyril Mourning back in the office to review and make appropriate alterations.  Hyperlipidemia History of hyperlipidemia not on statin therapy because of history of fibromyalgia with lipid profile performed 09/25/2018 revealing  total cholesterol 252, LDL 172 and HDL 46.  She is a good candidate for Repatha  Dyspnea on exertion History of dyspnea on exertion with a 2D echo performed 03/07/2018 revealing normal LV systolic function and diastolic dysfunction.  She does not have a history of tobacco abuse.  The coronary calcium score is low at 7.  I am going to get a Lexiscan Myoview stress test to rule out an ischemic etiology.  Thoracic aortic aneurysm ([place]) 2D echocardiogram performed 03/07/2018 showed a generous ascending thoracic aorta.  Recent CTA performed 06/07/2018 revealed the dimensions of the 4 cm x 3.9 cm.  This will need to be repeated on an annual basis.      Lorretta Harp MD FACP,FACC,FAHA, [facility] 10/17/2018 4:12 PM

## 2018-10-17 NOTE — Patient Instructions (Addendum)
Medication Instructions:  Your physician has recommended you make the following change in your medication:  STOP TAKING YOUR SHORT-ACTING DILTIAZEM 30 MG TABLETS  START TAKING LONG-ACTING/ EXTENDED RELEASE  DILTIAZEM (CARDIZEM CD) 180 MG BY MOUTH DAILY  If you need a refill on your cardiac medications before your next appointment, please call your pharmacy.   Lab work: NONE If you have labs (blood work) drawn today and your tests are completely normal, you will receive your results only by: Marland Kitchen MyChart Message (if you have MyChart) OR . A paper copy in the mail If you have any lab test that is abnormal or we need to change your treatment, we will call you to review the results.  Testing/Procedures: Your physician has requested that you have a lexiscan myoview. For further information please visit [email or web address]. Please follow instruction sheet, as given.    Follow-Up: At [facility], you and your health needs are our priority.  As part of our continuing mission to provide you with exceptional heart care, we have created designated Provider Care Teams.  These Care Teams include your primary Cardiologist (physician) and Advanced Practice Providers (APPs -  Physician Assistants and Nurse Practitioners) who all work together to provide you with the care you need, when you need it. You will need a follow up appointment in 3 months with Dr. Quay Burow.  Please call our office 2 months in advance to schedule this/each appointment.    Any Other Special Instructions Will Be Listed Below (If Applicable). KEEP A BLOOD PRESSURE LOG FOR 30 DAYS THEN FOLLOW UP WITH A CLINICAL PHARMACIST IN THE HYPERTENSION CLINIC. YOU WILL NEED AN IN-PERSON APPOINTMENT IN THE OFFICE. PLEASE CALL TO SCHEDULE THIS IN-PERSON APPOINTMENT IF YOU DID NOT ALREADY DO SO DURING YOUR LAST VISIT AT [facility].   YOU WILL BE CONTACTED BY A CLINICAL PHARMACIST REGARDING THE STATUS OF APPROVAL FOR  [place]

## 2018-10-17 NOTE — Assessment & Plan Note (Signed)
Recent onset of essential hypertension with blood pressure readings at home on the high side despite being on 3 times daily short acting diltiazem 30 mg a day.  I am going to change her to Cardizem CD 180 mg a day and will have her keep a blood pressure log for 30 days.  We will make an appointment for her to see Jean Scott back in the office to review and make appropriate alterations.

## 2018-10-17 NOTE — Assessment & Plan Note (Signed)
2D echocardiogram performed 03/07/2018 showed a generous ascending thoracic aorta.  Recent CTA performed 06/07/2018 revealed the dimensions of the 4 cm x 3.9 cm.  This will need to be repeated on an annual basis.

## 2018-10-23 ENCOUNTER — Telehealth: Payer: Self-pay | Admitting: Pharmacist

## 2018-10-23 MED ORDER — REPATHA SURECLICK 140 MG/ML ~~LOC~~ SOAJ: 140.0000 mg | SUBCUTANEOUS | 11 refills | Status: DC

## 2018-10-23 NOTE — Telephone Encounter (Signed)
PA for Repatha approved. Rx sent to prefer pharmacy.  Patient will call if financial help needed.

## 2018-10-26 ENCOUNTER — Telehealth (HOSPITAL_COMMUNITY): Payer: Self-pay

## 2018-10-26 NOTE — Telephone Encounter (Signed)
Encounter complete. 

## 2018-10-31 ENCOUNTER — Other Ambulatory Visit: Payer: Self-pay

## 2018-10-31 ENCOUNTER — Ambulatory Visit (HOSPITAL_COMMUNITY)
Admission: RE | Admit: 2018-10-31 | Discharge: 2018-10-31 | Disposition: A | Discharge status: Home / Self Care | Payer: PPO | Source: Ambulatory Visit | Attending: Cardiovascular Disease | Admitting: Cardiovascular Disease

## 2018-10-31 DIAGNOSIS — R0600 Dyspnea, unspecified: Secondary | ICD-10-CM | POA: Diagnosis not present

## 2018-10-31 DIAGNOSIS — R0609 Other forms of dyspnea: Secondary | ICD-10-CM

## 2018-10-31 LAB — MYOCARDIAL PERFUSION IMAGING
LV dias vol: 79 mL (ref 46–106)
LV sys vol: 28 mL
Peak HR: 84 {beats}/min
Rest HR: 63 {beats}/min
SDS: 2
SRS: 2
SSS: 4
TID: 1.11

## 2018-10-31 MED ORDER — TECHNETIUM TC 99M TETROFOSMIN IV KIT
31.1000 | PACK | Freq: Once | INTRAVENOUS | Status: AC | PRN
  Administered 2018-10-31: 31.1 via INTRAVENOUS
  Filled 2018-10-31: qty 32

## 2018-10-31 MED ORDER — REGADENOSON 0.4 MG/5ML IV SOLN
0.4000 mg | Freq: Once | INTRAVENOUS | Status: AC
  Administered 2018-10-31: 0.4 mg via INTRAVENOUS

## 2018-10-31 MED ORDER — TECHNETIUM TC 99M TETROFOSMIN IV KIT
9.3000 | PACK | Freq: Once | INTRAVENOUS | Status: AC | PRN
  Administered 2018-10-31: 9.3 via INTRAVENOUS
  Filled 2018-10-31: qty 10

## 2018-11-22 DIAGNOSIS — H40013 Open angle with borderline findings, low risk, bilateral: Secondary | ICD-10-CM | POA: Diagnosis not present

## 2018-11-22 DIAGNOSIS — Z961 Presence of intraocular lens: Secondary | ICD-10-CM | POA: Diagnosis not present

## 2018-11-22 DIAGNOSIS — H52221 Regular astigmatism, right eye: Secondary | ICD-10-CM | POA: Diagnosis not present

## 2018-11-22 DIAGNOSIS — H26491 Other secondary cataract, right eye: Secondary | ICD-10-CM | POA: Diagnosis not present

## 2018-11-22 DIAGNOSIS — H5201 Hypermetropia, right eye: Secondary | ICD-10-CM | POA: Diagnosis not present

## 2018-11-22 DIAGNOSIS — H5212 Myopia, left eye: Secondary | ICD-10-CM | POA: Diagnosis not present

## 2018-12-08 ENCOUNTER — Telehealth: Payer: Self-pay | Admitting: Pharmacist

## 2018-12-08 NOTE — Telephone Encounter (Signed)
PA expired Dec/10 and patient will like a call back as follow up.   Will also like information about financial assistance available for Repatha.

## 2018-12-11 NOTE — Telephone Encounter (Signed)
Called the pt to let them know that they are approved w/out pa for repatha and to apply for the [employer]. The pt voiced understanding.

## 2018-12-19 ENCOUNTER — Telehealth: Payer: Self-pay | Admitting: Cardiovascular Disease

## 2018-12-19 NOTE — Telephone Encounter (Signed)
Pa sent to plan 12/19/18

## 2018-12-19 NOTE — Telephone Encounter (Signed)
[facility] calling for prior auth for Repatha. Ref# [id number].

## 2019-01-08 ENCOUNTER — Other Ambulatory Visit: Payer: Self-pay

## 2019-01-08 ENCOUNTER — Ambulatory Visit: Payer: PPO | Admitting: Cardiology

## 2019-01-08 VITALS — Ht 64.0 in | Wt 156.0 lb

## 2019-01-08 DIAGNOSIS — I10 Essential (primary) hypertension: Secondary | ICD-10-CM

## 2019-01-08 DIAGNOSIS — E039 Hypothyroidism, unspecified: Secondary | ICD-10-CM

## 2019-01-08 DIAGNOSIS — M797 Fibromyalgia: Secondary | ICD-10-CM

## 2019-01-08 DIAGNOSIS — I712 Thoracic aortic aneurysm, without rupture, unspecified: Secondary | ICD-10-CM

## 2019-01-08 DIAGNOSIS — I491 Atrial premature depolarization: Secondary | ICD-10-CM | POA: Diagnosis not present

## 2019-01-08 DIAGNOSIS — G9332 Myalgic encephalomyelitis/chronic fatigue syndrome: Secondary | ICD-10-CM | POA: Insufficient documentation

## 2019-01-08 DIAGNOSIS — R0600 Dyspnea, unspecified: Secondary | ICD-10-CM | POA: Diagnosis not present

## 2019-01-08 DIAGNOSIS — R0609 Other forms of dyspnea: Secondary | ICD-10-CM

## 2019-01-08 DIAGNOSIS — E782 Mixed hyperlipidemia: Secondary | ICD-10-CM | POA: Diagnosis not present

## 2019-01-08 DIAGNOSIS — R5382 Chronic fatigue, unspecified: Secondary | ICD-10-CM

## 2019-01-08 NOTE — Assessment & Plan Note (Signed)
Echo March 2020- normal LVF, grade 1 DD Myoview 10/31/2018- negative CA++ score 7

## 2019-01-08 NOTE — Assessment & Plan Note (Signed)
Frequent PACs on exam today, fortunately she is relatively asymptomatic with these

## 2019-01-08 NOTE — Progress Notes (Signed)
Cardiology Office Note:    Date:  01/08/2019   ID:  Jean Scott, DOB [birth year], MRN [id number]  PCP:  Prince Solian, MD  Cardiologist:  Quay Burow, MD  Electrophysiologist:  None   Referring MD: Prince Solian, MD   No chief complaint on file.   History of Present Illness:    Jean Scott is a 69 y.o. female with a hx of chronic fatigue syndrome and fibromyalgia.  She is followed by a specialist in [place].  She has chronic dyspnea.  Myoview in October 2020 was low risk.  Echocardiogram in March 2020 showed normal LV function with an EF of 55 to 60%.  There was impaired diastolic relaxation.  She also has a history of an enlarged thoracic aorta, this was measured at 4 cm by CTA June 2020.  She had been referred to Dr. Gwenlyn Found for evaluation of dyspnea on exertion.  At this time we have no clear diagnosis for this, it appears to be a chronic complaint.  She was noted to be hypertensive recently and placed on diltiazem.  Dr. Gwenlyn Found adjusted this to diltiazem CD 180 mg daily and she monitored her blood pressures at home.  She is here today for follow-up.  She has complaints of fatigue, somewhat worse in the last couple days but really no new symptoms.  Readings of her blood pressure at home show a consistent improvement, her systolic is now consistently in the [id number] and diastolic in 60 to [id number].  She did have frequent PACs on exam, this was not reflected on her EKG.  Fortunately she is relatively asymptomatic with this.  Past Medical History:  Diagnosis Date  . Allergic rhinitis    Dust mites, mold, plant polens  . Anemia 1970's  . Arm fracture 1957  . Depression 1991   6 months  . Elevated uric acid in blood   . Fibroids   . Fibromyalgia 1990  . Fibrosis, breast    Mammogram  . Glucose intolerance (impaired glucose tolerance)   . Grand mal seizure ([place]) 1960  . Hemorrhoids, internal [id number]  . History of hypercalcemia   . Hyperlipidemia   . Hypothyroidism 1990  .  Osteopenia 2012   Bone density Test  . Seizures ([place])   . Urinary tract infection     Past Surgical History:  Procedure Laterality Date  . ABDOMINAL HYSTERECTOMY  1989   open surgery for severe endometriosis but did initially have laproscopic  . cataracts bilat     10/24/14 10/31/2014  . CYSTOSCOPY     stent removal-[facility]-Jan 25,2017  . Laparotomy  1987  . URETEROSCOPY     Jan 10,2017    Current Medications: Current Meds  Medication Sig  . amphetamine-dextroamphetamine (ADDERALL) 20 MG tablet Take 20 mg in the morning and 10 mg at lunch  . anti-nausea (EMETROL) solution Take 10 mLs by mouth 2 (two) times daily.  . Cholecalciferol (VITAMIN D3) 2000 units TABS Take 2,000 Units by mouth daily.  . clonazePAM (KLONOPIN) 1 MG tablet Take .75 mg at bedtime and .25 mg in the morning  . conjugated estrogens (PREMARIN) vaginal cream Place 1 Applicatorful vaginally daily.  Marland Kitchen diltiazem (CARDIZEM CD) 180 MG 24 hr capsule Take 1 capsule (180 mg total) by mouth daily.  . Diphenhydramine-APAP, sleep, (EXCEDRIN PM) 38-500 MG TABS Take by mouth at bedtime.   . Evolocumab (REPATHA SURECLICK) [id number] MG/ML SOAJ Inject 140 mg into the skin every 14 (fourteen) days.  . fluticasone (FLONASE) 50 MCG/ACT nasal  spray Place 1 spray into both nostrils daily.  . Ibuprofen 200 MG CAPS Take by mouth.  . levothyroxine (SYNTHROID, LEVOTHROID) 88 MCG tablet Take 88 mcg by mouth daily before breakfast.  . Melatonin 2.5 MG CAPS Take 2.5 mg by mouth.  . mometasone (ELOCON) 0.1 % cream   . Omega-3 Fatty Acids (FISH OIL PO) Take 2 capsules by mouth daily.  Marland Kitchen omeprazole (PRILOSEC OTC) 20 MG tablet Take 20 mg by mouth as needed.  . Probiotic Product (PROBIOTIC DAILY PO) Take 1 tablet by mouth daily.  . Red Yeast Rice Extract (RED YEAST RICE PO) Take 1 capsule by mouth daily.   Current Facility-Administered Medications for the 01/08/19 encounter (Office Visit) with Erlene Quan, PA-C  Medication  . 0.9 %  sodium chloride  infusion     Allergies:   Sulfamethoxazole-trimethoprim, Alprazolam, Chlorthalidone, Ciprofloxacin, Epinephrine, and Potassium citrate   Social History   Socioeconomic History  . Marital status: Married    Spouse name: Not on file  . Number of children: 0  . Years of education: Not on file  . Highest education level: Not on file  Occupational History  . Occupation: Disabled  Tobacco Use  . Smoking status: Never Smoker  . Smokeless tobacco: Never Used  Substance and Sexual Activity  . Alcohol use: No  . Drug use: No  . Sexual activity: Not on file  Other Topics Concern  . Not on file  Social History Narrative  . Not on file   Social Determinants of Health   Financial Resource Strain:   . Difficulty of Paying Living Expenses: Not on file  Food Insecurity:   . Worried About Charity fundraiser in the Last Year: Not on file  . Ran Out of Food in the Last Year: Not on file  Transportation Needs:   . Lack of Transportation (Medical): Not on file  . Lack of Transportation (Non-Medical): Not on file  Physical Activity:   . Days of Exercise per Week: Not on file  . Minutes of Exercise per Session: Not on file  Stress:   . Feeling of Stress : Not on file  Social Connections:   . Frequency of Communication with Friends and Family: Not on file  . Frequency of Social Gatherings with Friends and Family: Not on file  . Attends Religious Services: Not on file  . Active Member of Clubs or Organizations: Not on file  . Attends Archivist Meetings: Not on file  . Marital Status: Not on file     Family History: The patient's family history includes Arthritis in her brother; Breast cancer in her sister; Gout in her brother; Hypertension in her mother; Kidney Stones in her brother; Osteoporosis in her mother; Pancreatic cancer in her paternal aunt; Parkinson's disease in her father. There is no history of Colon cancer or Colon polyps.  ROS:   Please see the history of  present illness.   All other systems reviewed and are negative.  EKGs/Labs/Other Studies Reviewed:    The following studies were reviewed today: Echo March 2020 Myoview Oct 2020 CTA June 2020  EKG:  EKG is ordered today.  The ekg ordered today demonstrates NSR, HR 70- no acute changes  Recent Labs: 05/31/2018: BUN 15; Creatinine, Ser 0.87; Hemoglobin 15.0; Platelets 289; Potassium 4.4; Sodium 140  Recent Lipid Panel No results found for: CHOL, TRIG, HDL, CHOLHDL, VLDL, LDLCALC, LDLDIRECT  Physical Exam:    VS:  Ht 5\' 4"  (1.626 m)  Wt 156 lb (70.8 kg)   SpO2 98%   BMI 26.78 kg/m     Wt Readings from Last 3 Encounters:  01/08/19 156 lb (70.8 kg)  10/31/18 157 lb (71.2 kg)  10/17/18 157 lb 12.8 oz (71.6 kg)     GEN: Well nourished, well developed in no acute distress HEENT: Normal NECK: No JVD; No carotid bruits CARDIAC: RRR, soft systolic murmur AOV,  Frequent extra systole, no rubs, gallops RESPIRATORY:  Clear to auscultation without rales, wheezing or rhonchi  ABDOMEN: Soft, non-tender, non-distended MUSCULOSKELETAL:  No edema; No deformity  SKIN: Warm and dry NEUROLOGIC:  Alert and oriented x 3 PSYCHIATRIC:  Normal affect   ASSESSMENT:    Essential hypertension Here for follow up.  Her B/P is now well controlled on Diltiazem 180 mg- B/P by me 126/64  Premature atrial contractions Frequent PACs on exam today, fortunately she is relatively asymptomatic with these  Chronic fatigue syndrome with fibromyalgia On Aderall and Klonopin- followed by a specialist in [place]. Not a candidate for statin Rx or beta blocker Rx  Dyspnea on exertion Echo March 2020- normal LVF, grade 1 DD Myoview 10/31/2018- negative CA++ score 7  Hyperlipidemia Not on statin Rx secondary to history of fibromyalgia- she is on Repatha  Hypothyroid On Synthroid- followed by PCP  Thoracic aortic aneurysm [facility]) Thoracic aortic aneurysm-4cm by CTA June 2020  PLAN:    In order of  problems listed above:  Same Rx.  F/U Dr Gwenlyn Found in 6 months.    Medication Adjustments/Labs and Tests Ordered: Current medicines are reviewed at length with the patient today.  Concerns regarding medicines are outlined above.  Orders Placed This Encounter  Procedures  . EKG 12-Lead   No orders of the defined types were placed in this encounter.   Patient Instructions  Medication Instructions:  Your physician recommends that you continue on your current medications as directed. Please refer to the Current Medication list given to you today.  *If you need a refill on your cardiac medications before your next appointment, please call your pharmacy*  Lab Work: None  If you have labs (blood work) drawn today and your tests are completely normal, you will receive your results only by: Marland Kitchen MyChart Message (if you have MyChart) OR . A paper copy in the mail If you have any lab test that is abnormal or we need to change your treatment, we will call you to review the results.  Testing/Procedures: None   Follow-Up: At [facility], you and your health needs are our priority.  As part of our continuing mission to provide you with exceptional heart care, we have created designated Provider Care Teams.  These Care Teams include your primary Cardiologist (physician) and Advanced Practice Providers (APPs -  Physician Assistants and Nurse Practitioners) who all work together to provide you with the care you need, when you need it.  Your next appointment:   6 month(s)  The format for your next appointment:   Either In Person or Virtual  Provider:   Quay Burow, MD  Other Instructions     Signed, Kerin Ransom, PA-C  01/08/2019 2:04 PM    [place]

## 2019-01-08 NOTE — Patient Instructions (Addendum)
Medication Instructions:  Your physician recommends that you continue on your current medications as directed. Please refer to the Current Medication list given to you today.  *If you need a refill on your cardiac medications before your next appointment, please call your pharmacy*  Lab Work: None  If you have labs (blood work) drawn today and your tests are completely normal, you will receive your results only by: Marland Kitchen MyChart Message (if you have MyChart) OR . A paper copy in the mail If you have any lab test that is abnormal or we need to change your treatment, we will call you to review the results.  Testing/Procedures: None   Follow-Up: At [facility], you and your health needs are our priority.  As part of our continuing mission to provide you with exceptional heart care, we have created designated Provider Care Teams.  These Care Teams include your primary Cardiologist (physician) and Advanced Practice Providers (APPs -  Physician Assistants and Nurse Practitioners) who all work together to provide you with the care you need, when you need it.  Your next appointment:   6 month(s)  The format for your next appointment:   Either In Person or Virtual  Provider:   Quay Burow, MD  Other Instructions

## 2019-01-08 NOTE — Assessment & Plan Note (Signed)
Thoracic aortic aneurysm-4cm by CTA June 2020

## 2019-01-08 NOTE — Assessment & Plan Note (Signed)
On Aderall and Klonopin- followed by a specialist in [place]. Not a candidate for statin Rx or beta blocker Rx

## 2019-01-08 NOTE — Assessment & Plan Note (Signed)
Here for follow up.  Her B/P is now well controlled on Diltiazem 180 mg- B/P by me 126/64

## 2019-01-08 NOTE — Assessment & Plan Note (Signed)
Not on statin Rx secondary to history of fibromyalgia- she is on Repatha

## 2019-01-08 NOTE — Assessment & Plan Note (Signed)
On Synthroid- followed by PCP

## 2019-02-21 NOTE — Telephone Encounter (Signed)
Opened in Error.

## 2019-03-09 DIAGNOSIS — R5382 Chronic fatigue, unspecified: Secondary | ICD-10-CM | POA: Diagnosis not present

## 2019-03-21 DIAGNOSIS — N952 Postmenopausal atrophic vaginitis: Secondary | ICD-10-CM | POA: Diagnosis not present

## 2019-03-21 DIAGNOSIS — N200 Calculus of kidney: Secondary | ICD-10-CM | POA: Diagnosis not present

## 2019-03-21 DIAGNOSIS — N3020 Other chronic cystitis without hematuria: Secondary | ICD-10-CM | POA: Diagnosis not present

## 2019-03-29 DIAGNOSIS — N200 Calculus of kidney: Secondary | ICD-10-CM | POA: Diagnosis not present

## 2019-03-29 DIAGNOSIS — I712 Thoracic aortic aneurysm, without rupture: Secondary | ICD-10-CM | POA: Diagnosis not present

## 2019-03-29 DIAGNOSIS — K5900 Constipation, unspecified: Secondary | ICD-10-CM | POA: Diagnosis not present

## 2019-03-29 DIAGNOSIS — R5382 Chronic fatigue, unspecified: Secondary | ICD-10-CM | POA: Diagnosis not present

## 2019-03-29 DIAGNOSIS — E785 Hyperlipidemia, unspecified: Secondary | ICD-10-CM | POA: Diagnosis not present

## 2019-03-29 DIAGNOSIS — R7301 Impaired fasting glucose: Secondary | ICD-10-CM | POA: Diagnosis not present

## 2019-03-29 DIAGNOSIS — J309 Allergic rhinitis, unspecified: Secondary | ICD-10-CM | POA: Diagnosis not present

## 2019-03-29 DIAGNOSIS — E039 Hypothyroidism, unspecified: Secondary | ICD-10-CM | POA: Diagnosis not present

## 2019-03-29 DIAGNOSIS — I10 Essential (primary) hypertension: Secondary | ICD-10-CM | POA: Diagnosis not present

## 2019-03-30 DIAGNOSIS — E7849 Other hyperlipidemia: Secondary | ICD-10-CM | POA: Diagnosis not present

## 2019-06-08 ENCOUNTER — Other Ambulatory Visit: Payer: Self-pay

## 2019-06-08 ENCOUNTER — Encounter: Payer: Self-pay | Admitting: Cardiovascular Disease

## 2019-06-08 ENCOUNTER — Ambulatory Visit: Payer: PPO | Admitting: Cardiovascular Disease

## 2019-06-08 VITALS — BP 150/84 | HR 74 | Ht 64.0 in | Wt 155.4 lb

## 2019-06-08 DIAGNOSIS — Z01812 Encounter for preprocedural laboratory examination: Secondary | ICD-10-CM

## 2019-06-08 DIAGNOSIS — I491 Atrial premature depolarization: Secondary | ICD-10-CM

## 2019-06-08 DIAGNOSIS — I712 Thoracic aortic aneurysm, without rupture, unspecified: Secondary | ICD-10-CM

## 2019-06-08 NOTE — Patient Instructions (Signed)
Medication Instructions:  Your physician recommends that you continue on your current medications as directed. Please refer to the Current Medication list given to you today.  *If you need a refill on your cardiac medications before your next appointment, please call your pharmacy*   Lab Work: BMET prior to CT test  If you have labs (blood work) drawn today and your tests are completely normal, you will receive your results only by: Marland Kitchen MyChart Message (if you have MyChart) OR . A paper copy in the mail If you have any lab test that is abnormal or we need to change your treatment, we will call you to review the results.   Testing/Procedures: CT Angiogram Chest/Aorta to evaluate thoracic aortic aneurysm NOW and in 1 year   Follow-Up: At [facility], you and your health needs are our priority.  As part of our continuing mission to provide you with exceptional heart care, we have created designated Provider Care Teams.  These Care Teams include your primary Cardiologist (physician) and Advanced Practice Providers (APPs -  Physician Assistants and Nurse Practitioners) who all work together to provide you with the care you need, when you need it.  We recommend signing up for the patient portal called "MyChart".  Sign up information is provided on this After Visit Summary.  MyChart is used to connect with patients for Virtual Visits (Telemedicine).  Patients are able to view lab/test results, encounter notes, upcoming appointments, etc.  Non-urgent messages can be sent to your provider as well.   To learn more about what you can do with MyChart, go to [email or web address].    Your next appointment:   12 month(s)  The format for your next appointment:   In Person  Provider:   You may see Quay Burow, MD or one of the following Advanced Practice Providers on your designated Care Team:    Kerin Ransom, PA-C  [place], [place]  Coletta Memos, [place]    Other Instructions

## 2019-06-08 NOTE — Assessment & Plan Note (Signed)
History of dyspnea on exertion with a low risk Myoview a normal 2D echo with grade 1 diastolic dysfunction.  We discussed referral to a pulmonologist and formal pulmonary work-up including PFTs.

## 2019-06-08 NOTE — Assessment & Plan Note (Signed)
History of thoracic aortic aneurysm measuring 4 cm by CTA June 2020.  We will recheck a chest CTA

## 2019-06-08 NOTE — Assessment & Plan Note (Signed)
History of essential hypertension a blood pressure measured today 150/84 although usually it is somewhat lower than this.  She is on diltiazem CD 180 mg a day.

## 2019-06-08 NOTE — Progress Notes (Signed)
06/08/2019 [name]   08-27-1950  [id number]  Primary Physician Prince Solian, MD Primary Cardiologist: Lorretta Harp MD Lupe Carney, [place]  HPI:  Jean Scott is a 69 y.o.  mildly overweight married Caucasian female with no children whose husband [name] is also a patient of mine. She was referred by Dr. Rudean Haskell cardiovascular valuation because of dyspnea.   I last saw her in the office 10/17/2018.  Her cardiac risk factors are notable for untreated hyperlipidemia. She is hypertensive today but normally she is not nor she had medicines for this. She has never smoked. There is no family history of heart disease. She is never had a heart attack or stroke. She denies chest pain. She does have chronic fatigue syndrome, fibromyalgia and seizure disorder. She had a 2D echocardiogram performed because of dyspnea and murmur 05/15/2015 which was essentially normal. She complains of shortness of breath on minimal activity.  She had a 2D echo 03/07/2018 which was essentially normal except for some diastolic dysfunction and a thoracic aorta which was generous.  Follow-up chest CTA performed 06/07/2018 revealed this to be about 4 cm.  The coronary calcium score is measured at 7 making it unlikely that she has CAD although she continues to complain of dyspnea without a clear etiology.  In addition, she has recently become hypertensive and was put on short acting diltiazem with a blood pressure log at home showing borderline hypertensive readings.  Her lipid profile had been significantly elevated as well.  We adjusted her antihypertensive medications and consolidated her diltiazem.  Blood pressures been under better control.  She was placed on Repatha with an incredible response, lipid profile performed 01/30/2019 revealed total cholesterol 135, LDL 58 and HDL 48.  She continues to have fatigue and dyspnea on exertion.  Her 2D echo was essentially normal except for grade 1 diastolic  dysfunction and a Myoview was normal as well.  Chest CT did show a 4 cm ascending thoracic aortic aneurysm.   Current Meds  Medication Sig  . amphetamine-dextroamphetamine (ADDERALL) 20 MG tablet Take 20 mg in the morning and 10 mg at lunch  . anti-nausea (EMETROL) solution Take 10 mLs by mouth 2 (two) times daily.  . Cholecalciferol (VITAMIN D3) 2000 units TABS Take 2,000 Units by mouth daily.  . clonazePAM (KLONOPIN) 1 MG tablet Take .75 mg at bedtime and .25 mg in the morning  . conjugated estrogens (PREMARIN) vaginal cream Place 1 Applicatorful vaginally daily.  Marland Kitchen diltiazem (CARDIZEM CD) 180 MG 24 hr capsule Take 1 capsule (180 mg total) by mouth daily.  . Diphenhydramine-APAP, sleep, (EXCEDRIN PM) 38-500 MG TABS Take by mouth at bedtime.   . Evolocumab (REPATHA SURECLICK) 379 MG/ML SOAJ Inject 140 mg into the skin every 14 (fourteen) days.  . fluticasone (FLONASE) 50 MCG/ACT nasal spray Place 1 spray into both nostrils daily.  . Ibuprofen 200 MG CAPS Take by mouth.  . levothyroxine (SYNTHROID, LEVOTHROID) 88 MCG tablet Take 88 mcg by mouth daily before breakfast.  . Melatonin 2.5 MG CAPS Take 2.5 mg by mouth.  . mometasone (ELOCON) 0.1 % cream   . Omega-3 Fatty Acids (FISH OIL PO) Take 2 capsules by mouth daily.  Marland Kitchen omeprazole (PRILOSEC OTC) 20 MG tablet Take 20 mg by mouth as needed.  . Probiotic Product (PROBIOTIC DAILY PO) Take 1 tablet by mouth daily.  . Red Yeast Rice Extract (RED YEAST RICE PO) Take 1 capsule by mouth daily.  . [DISCONTINUED] diltiazem (  DILTIAZEM CD) 180 MG 24 hr capsule Diltiazem CD 180 mg capsule,extended release   1 capsule every day by oral route.   Current Facility-Administered Medications for the 06/08/19 encounter (Office Visit) with Lorretta Harp, MD  Medication  . 0.9 %  sodium chloride infusion     Allergies  Allergen Reactions  . Sulfamethoxazole-Trimethoprim Other (See Comments)    Anxiety, jittery  . Alprazolam Other (See Comments)    Get  too groggy can't function  . Chlorthalidone     Constipation   . Ciprofloxacin     Pt gets dehydrated  . Epinephrine     Makes pt feel like they are coming out of their skin  . Potassium Citrate     Constipation; "CSF crash so I wasn't functioning well at all"    Social History   Socioeconomic History  . Marital status: Married    Spouse name: Not on file  . Number of children: 0  . Years of education: Not on file  . Highest education level: Not on file  Occupational History  . Occupation: Disabled  Tobacco Use  . Smoking status: Never Smoker  . Smokeless tobacco: Never Used  Substance and Sexual Activity  . Alcohol use: No  . Drug use: No  . Sexual activity: Not on file  Other Topics Concern  . Not on file  Social History Narrative  . Not on file   Social Determinants of Health   Financial Resource Strain:   . Difficulty of Paying Living Expenses:   Food Insecurity:   . Worried About Charity fundraiser in the Last Year:   . Arboriculturist in the Last Year:   Transportation Needs:   . Film/video editor (Medical):   Marland Kitchen Lack of Transportation (Non-Medical):   Physical Activity:   . Days of Exercise per Week:   . Minutes of Exercise per Session:   Stress:   . Feeling of Stress :   Social Connections:   . Frequency of Communication with Friends and Family:   . Frequency of Social Gatherings with Friends and Family:   . Attends Religious Services:   . Active Member of Clubs or Organizations:   . Attends Archivist Meetings:   Marland Kitchen Marital Status:   Intimate Partner Violence:   . Fear of Current or Ex-Partner:   . Emotionally Abused:   Marland Kitchen Physically Abused:   . Sexually Abused:      Review of Systems: General: negative for chills, fever, night sweats or weight changes.  Cardiovascular: negative for chest pain, dyspnea on exertion, edema, orthopnea, palpitations, paroxysmal nocturnal dyspnea or shortness of breath Dermatological: negative for  rash Respiratory: negative for cough or wheezing Urologic: negative for hematuria Abdominal: negative for nausea, vomiting, diarrhea, bright red blood per rectum, melena, or hematemesis Neurologic: negative for visual changes, syncope, or dizziness All other systems reviewed and are otherwise negative except as noted above.    Blood pressure (!) 150/84, pulse 74, height 5\' 4"  (1.626 m), weight 155 lb 6.4 oz (70.5 kg), SpO2 99 %.  General appearance: alert and no distress Neck: no adenopathy, no carotid bruit, no JVD, supple, symmetrical, trachea midline and thyroid not enlarged, symmetric, no tenderness/mass/nodules Lungs: clear to auscultation bilaterally Heart: regular rate and rhythm, S1, S2 normal, no murmur, click, rub or gallop Extremities: extremities normal, atraumatic, no cyanosis or edema Pulses: 2+ and symmetric Skin: Skin color, texture, turgor normal. No rashes or lesions Neurologic: Alert and oriented  X 3, normal strength and tone. Normal symmetric reflexes. Normal coordination and gait  EKG sinus rhythm at 74 with PACs.  I personally reviewed this EKG.  ASSESSMENT AND PLAN:   Dyspnea on exertion History of dyspnea on exertion with a low risk Myoview a normal 2D echo with grade 1 diastolic dysfunction.  We discussed referral to a pulmonologist and formal pulmonary work-up including PFTs.  Hyperlipidemia History of hyperlipidemia currently on Repatha with lipid profile performed 03/30/2019 revealing total cholesterol of 135, LDL 58 and HDL 48.  Essential hypertension History of essential hypertension a blood pressure measured today 150/84 although usually it is somewhat lower than this.  She is on diltiazem CD 180 mg a day.  Thoracic aortic aneurysm [facility]) History of thoracic aortic aneurysm measuring 4 cm by CTA June 2020.  We will recheck a chest CTA  Premature atrial contractions History of PACs seen on twelve-lead EKG but asymptomatic from these.      Lorretta Harp MD FACP,FACC,FAHA, [facility] 06/08/2019 3:17 PM

## 2019-06-08 NOTE — Assessment & Plan Note (Signed)
History of PACs seen on twelve-lead EKG but asymptomatic from these.

## 2019-06-08 NOTE — Assessment & Plan Note (Signed)
History of hyperlipidemia currently on Repatha with lipid profile performed 03/30/2019 revealing total cholesterol of 135, LDL 58 and HDL 48.

## 2019-06-11 ENCOUNTER — Telehealth: Payer: Self-pay | Admitting: Cardiovascular Disease

## 2019-06-11 NOTE — Telephone Encounter (Addendum)
Held for several minutes X2 for for [name] with [facility] to advise her pt needed chest Ct now and one year per Dr. Kennon Holter orders.   Will try again later this morning. Marland Kitchen

## 2019-06-11 NOTE — Telephone Encounter (Signed)
[name] from [facility] [employer] stating they received an order for a CT, but the patient already has a CT scheduled 06/26/2019. She would like to know if the patient still needs an appointment with them.

## 2019-06-11 NOTE — Telephone Encounter (Signed)
Lm for Jean Scott to call our office back in re: to her initial call to [place].

## 2019-06-12 NOTE — Telephone Encounter (Signed)
Lm to call back ./cy 

## 2019-06-12 NOTE — Telephone Encounter (Signed)
Spoke with [name] and she will defer the one ct order for next year as pt is needing one now and again in a year ./cy

## 2019-06-26 ENCOUNTER — Inpatient Hospital Stay: Admission: RE | Admit: 2019-06-26 | Payer: PPO | Source: Ambulatory Visit

## 2019-06-27 ENCOUNTER — Inpatient Hospital Stay: Admission: RE | Admit: 2019-06-27 | Payer: PPO | Source: Ambulatory Visit

## 2019-07-05 DIAGNOSIS — Z01812 Encounter for preprocedural laboratory examination: Secondary | ICD-10-CM | POA: Diagnosis not present

## 2019-07-06 LAB — BASIC METABOLIC PANEL
BUN/Creatinine Ratio: 16 (ref 12–28)
BUN: 14 mg/dL (ref 8–27)
CO2: 25 mmol/L (ref 20–29)
Calcium: 11.1 mg/dL — ABNORMAL HIGH (ref 8.7–10.3)
Chloride: 103 mmol/L (ref 96–106)
Creatinine, Ser: 0.85 mg/dL (ref 0.57–1.00)
GFR calc Af Amer: 81 mL/min/{1.73_m2} (ref >59)
GFR calc non Af Amer: 70 mL/min/{1.73_m2} (ref >59)
Glucose: 97 mg/dL (ref 65–99)
Potassium: 4.4 mmol/L (ref 3.5–5.2)
Sodium: 143 mmol/L (ref 134–144)

## 2019-07-11 ENCOUNTER — Other Ambulatory Visit: Payer: Self-pay

## 2019-07-11 ENCOUNTER — Ambulatory Visit (INDEPENDENT_AMBULATORY_CARE_PROVIDER_SITE_OTHER)
Admission: RE | Admit: 2019-07-11 | Discharge: 2019-07-11 | Disposition: A | Discharge status: Home / Self Care | Payer: PPO | Source: Ambulatory Visit | Attending: Cardiovascular Disease | Admitting: Cardiovascular Disease

## 2019-07-11 DIAGNOSIS — I712 Thoracic aortic aneurysm, without rupture, unspecified: Secondary | ICD-10-CM

## 2019-07-11 DIAGNOSIS — K449 Diaphragmatic hernia without obstruction or gangrene: Secondary | ICD-10-CM | POA: Diagnosis not present

## 2019-07-11 DIAGNOSIS — N6310 Unspecified lump in the right breast, unspecified quadrant: Secondary | ICD-10-CM | POA: Diagnosis not present

## 2019-07-11 DIAGNOSIS — R222 Localized swelling, mass and lump, trunk: Secondary | ICD-10-CM | POA: Diagnosis not present

## 2019-07-11 MED ORDER — IOHEXOL 350 MG/ML SOLN
100.0000 mL | Freq: Once | INTRAVENOUS | Status: AC | PRN
  Administered 2019-07-11: 100 mL via INTRAVENOUS

## 2019-08-02 ENCOUNTER — Institutional Professional Consult (permissible substitution): Payer: PPO | Admitting: Emergency Medicine

## 2019-08-14 ENCOUNTER — Other Ambulatory Visit: Payer: Self-pay | Admitting: Cardiovascular Disease

## 2019-09-26 ENCOUNTER — Other Ambulatory Visit: Payer: Self-pay | Admitting: Cardiovascular Disease

## 2019-10-03 DIAGNOSIS — L718 Other rosacea: Secondary | ICD-10-CM | POA: Diagnosis not present

## 2019-11-01 DIAGNOSIS — R7301 Impaired fasting glucose: Secondary | ICD-10-CM | POA: Diagnosis not present

## 2019-11-01 DIAGNOSIS — Z79899 Other long term (current) drug therapy: Secondary | ICD-10-CM | POA: Diagnosis not present

## 2019-11-01 DIAGNOSIS — E039 Hypothyroidism, unspecified: Secondary | ICD-10-CM | POA: Diagnosis not present

## 2019-11-01 DIAGNOSIS — E785 Hyperlipidemia, unspecified: Secondary | ICD-10-CM | POA: Diagnosis not present

## 2019-11-01 DIAGNOSIS — M859 Disorder of bone density and structure, unspecified: Secondary | ICD-10-CM | POA: Diagnosis not present

## 2019-11-08 DIAGNOSIS — R7301 Impaired fasting glucose: Secondary | ICD-10-CM | POA: Diagnosis not present

## 2019-11-08 DIAGNOSIS — E790 Hyperuricemia without signs of inflammatory arthritis and tophaceous disease: Secondary | ICD-10-CM | POA: Diagnosis not present

## 2019-11-08 DIAGNOSIS — Z0000 Encounter for general adult medical examination without abnormal findings: Secondary | ICD-10-CM | POA: Diagnosis not present

## 2019-11-08 DIAGNOSIS — I10 Essential (primary) hypertension: Secondary | ICD-10-CM | POA: Diagnosis not present

## 2019-11-08 DIAGNOSIS — M8580 Other specified disorders of bone density and structure, unspecified site: Secondary | ICD-10-CM | POA: Diagnosis not present

## 2019-11-08 DIAGNOSIS — E039 Hypothyroidism, unspecified: Secondary | ICD-10-CM | POA: Diagnosis not present

## 2019-11-08 DIAGNOSIS — R5382 Chronic fatigue, unspecified: Secondary | ICD-10-CM | POA: Diagnosis not present

## 2019-11-08 DIAGNOSIS — R82998 Other abnormal findings in urine: Secondary | ICD-10-CM | POA: Diagnosis not present

## 2019-11-08 DIAGNOSIS — I712 Thoracic aortic aneurysm, without rupture: Secondary | ICD-10-CM | POA: Diagnosis not present

## 2019-11-08 DIAGNOSIS — E785 Hyperlipidemia, unspecified: Secondary | ICD-10-CM | POA: Diagnosis not present

## 2019-11-08 DIAGNOSIS — R0609 Other forms of dyspnea: Secondary | ICD-10-CM | POA: Diagnosis not present

## 2019-11-08 DIAGNOSIS — I519 Heart disease, unspecified: Secondary | ICD-10-CM | POA: Diagnosis not present

## 2019-11-09 DIAGNOSIS — M8588 Other specified disorders of bone density and structure, other site: Secondary | ICD-10-CM | POA: Diagnosis not present

## 2019-11-09 DIAGNOSIS — N958 Other specified menopausal and perimenopausal disorders: Secondary | ICD-10-CM | POA: Diagnosis not present

## 2019-11-14 DIAGNOSIS — L718 Other rosacea: Secondary | ICD-10-CM | POA: Diagnosis not present

## 2019-11-20 DIAGNOSIS — E039 Hypothyroidism, unspecified: Secondary | ICD-10-CM | POA: Diagnosis not present

## 2019-12-18 DIAGNOSIS — Z1212 Encounter for screening for malignant neoplasm of rectum: Secondary | ICD-10-CM | POA: Diagnosis not present

## 2019-12-27 MED ORDER — CARVEDILOL 6.25 MG PO TABS: 6.2500 mg | ORAL_TABLET | Freq: Two times a day (BID) | ORAL | 3 refills | Status: DC

## 2019-12-27 NOTE — Addendum Note (Signed)
Addended by: Leotis Pain, Toole on: 12/27/2019 09:40 AM   Modules accepted: Orders

## 2019-12-27 NOTE — Telephone Encounter (Signed)
Called pt to relay recommendations from Pharmacist to discontinue diltiazem and begin taking carvedilol 6.25 mg twice a day. The pt was also advised to monitor her blood pressures and swelling and to reach back out to [place] if she has any further concerns. The patient verbalizes understanding and agreement with plan.

## 2020-01-29 NOTE — Telephone Encounter (Signed)
It looks like she started the Carvedilol just before Christmas, so those symptoms are most likely not related to the medication.  Have her continue to monitor pressure, but she may need to be seen by PA or PCP

## 2020-03-05 DIAGNOSIS — R569 Unspecified convulsions: Secondary | ICD-10-CM | POA: Diagnosis not present

## 2020-03-05 DIAGNOSIS — I10 Essential (primary) hypertension: Secondary | ICD-10-CM | POA: Diagnosis not present

## 2020-03-05 DIAGNOSIS — R5382 Chronic fatigue, unspecified: Secondary | ICD-10-CM | POA: Diagnosis not present

## 2020-03-31 DIAGNOSIS — N200 Calculus of kidney: Secondary | ICD-10-CM | POA: Diagnosis not present

## 2020-03-31 DIAGNOSIS — N3020 Other chronic cystitis without hematuria: Secondary | ICD-10-CM | POA: Diagnosis not present

## 2020-03-31 DIAGNOSIS — Q631 Lobulated, fused and horseshoe kidney: Secondary | ICD-10-CM | POA: Diagnosis not present

## 2020-03-31 DIAGNOSIS — N952 Postmenopausal atrophic vaginitis: Secondary | ICD-10-CM | POA: Diagnosis not present

## 2020-04-03 ENCOUNTER — Ambulatory Visit: Payer: PPO

## 2020-05-12 DIAGNOSIS — N3020 Other chronic cystitis without hematuria: Secondary | ICD-10-CM | POA: Diagnosis not present

## 2020-05-20 DIAGNOSIS — L821 Other seborrheic keratosis: Secondary | ICD-10-CM | POA: Diagnosis not present

## 2020-05-20 DIAGNOSIS — L718 Other rosacea: Secondary | ICD-10-CM | POA: Diagnosis not present

## 2020-05-20 DIAGNOSIS — L814 Other melanin hyperpigmentation: Secondary | ICD-10-CM | POA: Diagnosis not present

## 2020-05-20 DIAGNOSIS — D229 Melanocytic nevi, unspecified: Secondary | ICD-10-CM | POA: Diagnosis not present

## 2020-07-04 ENCOUNTER — Other Ambulatory Visit: Payer: Self-pay

## 2020-07-04 DIAGNOSIS — I712 Thoracic aortic aneurysm, without rupture, unspecified: Secondary | ICD-10-CM

## 2020-07-09 DIAGNOSIS — I10 Essential (primary) hypertension: Secondary | ICD-10-CM | POA: Diagnosis not present

## 2020-07-09 DIAGNOSIS — R569 Unspecified convulsions: Secondary | ICD-10-CM | POA: Diagnosis not present

## 2020-07-09 DIAGNOSIS — R5382 Chronic fatigue, unspecified: Secondary | ICD-10-CM | POA: Diagnosis not present

## 2020-07-10 ENCOUNTER — Other Ambulatory Visit: Payer: Self-pay

## 2020-07-10 DIAGNOSIS — I712 Thoracic aortic aneurysm, without rupture, unspecified: Secondary | ICD-10-CM

## 2020-07-11 ENCOUNTER — Other Ambulatory Visit: Payer: Self-pay

## 2020-07-11 DIAGNOSIS — I712 Thoracic aortic aneurysm, without rupture, unspecified: Secondary | ICD-10-CM

## 2020-07-11 NOTE — Telephone Encounter (Signed)
Jean Scott, pt needs a BMET order placed so she can get this done before her CT on 07/29. Can you place that order?

## 2020-07-11 NOTE — Telephone Encounter (Signed)
Thank you :)

## 2020-07-28 ENCOUNTER — Other Ambulatory Visit: Payer: Self-pay

## 2020-07-28 DIAGNOSIS — I712 Thoracic aortic aneurysm, without rupture, unspecified: Secondary | ICD-10-CM

## 2020-07-29 LAB — BASIC METABOLIC PANEL
BUN/Creatinine Ratio: 17 (ref 12–28)
BUN: 14 mg/dL (ref 8–27)
CO2: 19 mmol/L — ABNORMAL LOW (ref 20–29)
Calcium: 10.7 mg/dL — ABNORMAL HIGH (ref 8.7–10.3)
Chloride: 102 mmol/L (ref 96–106)
Creatinine, Ser: 0.81 mg/dL (ref 0.57–1.00)
Glucose: 97 mg/dL (ref 65–99)
Potassium: 4 mmol/L (ref 3.5–5.2)
Sodium: 141 mmol/L (ref 134–144)
eGFR: 78 mL/min/{1.73_m2} (ref >59)

## 2020-07-31 MED ORDER — ESTRADIOL 0.5 MG PO TABS: 0.5000 mg | ORAL_TABLET | Freq: Every day | ORAL | Status: DC

## 2020-07-31 NOTE — Addendum Note (Signed)
Addended by: Waylan Rocher on: 07/31/2020 01:08 PM   Modules accepted: Orders

## 2020-08-01 ENCOUNTER — Other Ambulatory Visit: Payer: Self-pay

## 2020-08-01 ENCOUNTER — Ambulatory Visit (INDEPENDENT_AMBULATORY_CARE_PROVIDER_SITE_OTHER)
Admission: RE | Admit: 2020-08-01 | Discharge: 2020-08-01 | Disposition: A | Discharge status: Home / Self Care | Payer: PPO | Source: Ambulatory Visit | Attending: Cardiovascular Disease | Admitting: Cardiovascular Disease

## 2020-08-01 DIAGNOSIS — I712 Thoracic aortic aneurysm, without rupture, unspecified: Secondary | ICD-10-CM

## 2020-08-01 MED ORDER — IOHEXOL 350 MG/ML SOLN
100.0000 mL | Freq: Once | INTRAVENOUS | Status: AC | PRN
  Administered 2020-08-01: 100 mL via INTRAVENOUS

## 2020-08-05 ENCOUNTER — Other Ambulatory Visit: Payer: Self-pay

## 2020-08-05 DIAGNOSIS — I712 Thoracic aortic aneurysm, without rupture, unspecified: Secondary | ICD-10-CM

## 2020-08-05 NOTE — Addendum Note (Signed)
Addended by: Rexanne Mano B on: 08/05/2020 08:24 AM   Modules accepted: Orders

## 2020-08-23 ENCOUNTER — Other Ambulatory Visit: Payer: Self-pay | Admitting: Cardiovascular Disease

## 2020-09-02 IMAGING — US ULTRASOUND RIGHT BREAST LIMITED
1 series · 5 of 5 positions shown · non-contrast
Comparison: March 17, 2018

CLINICAL DATA: 68-year-old patient recalled from recent screening
mammogram for evaluation possible mass and an asymmetry in the right
breast.

EXAM:
DIGITAL DIAGNOSTIC RIGHT MAMMOGRAM WITH CAD AND TOMO
ULTRASOUND RIGHT BREAST

[Series 1: ultrasound right breast limited · 0.07mm/px · 5 of 5 slices shown]
[im 1/5]
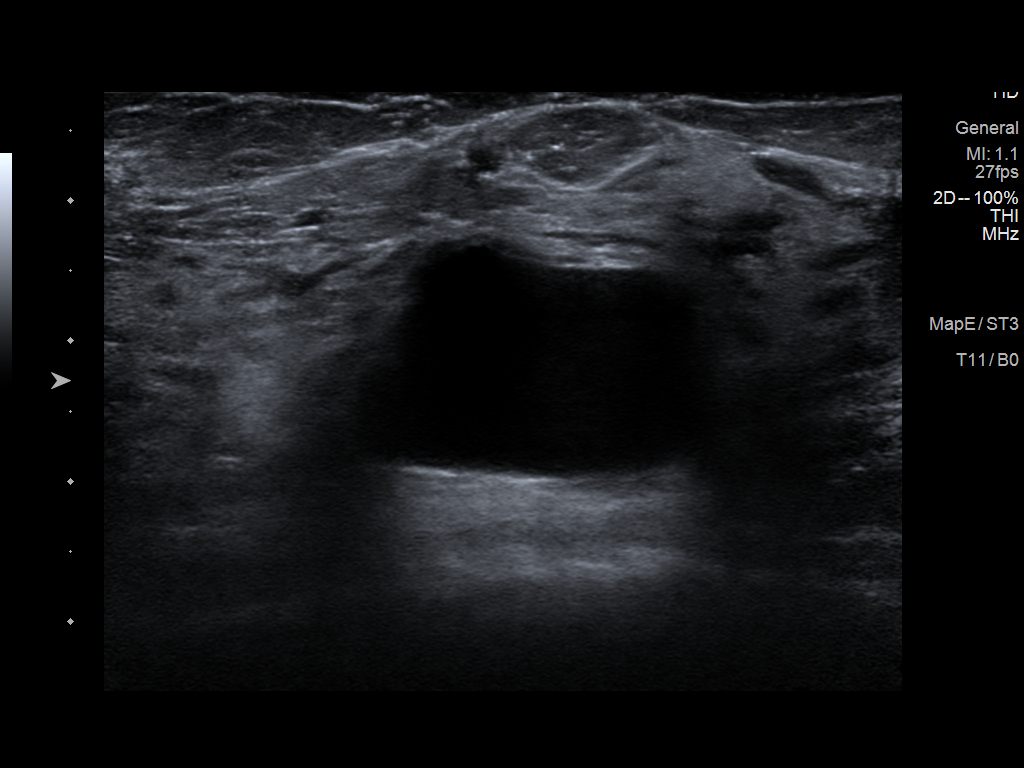
[im 2/5]
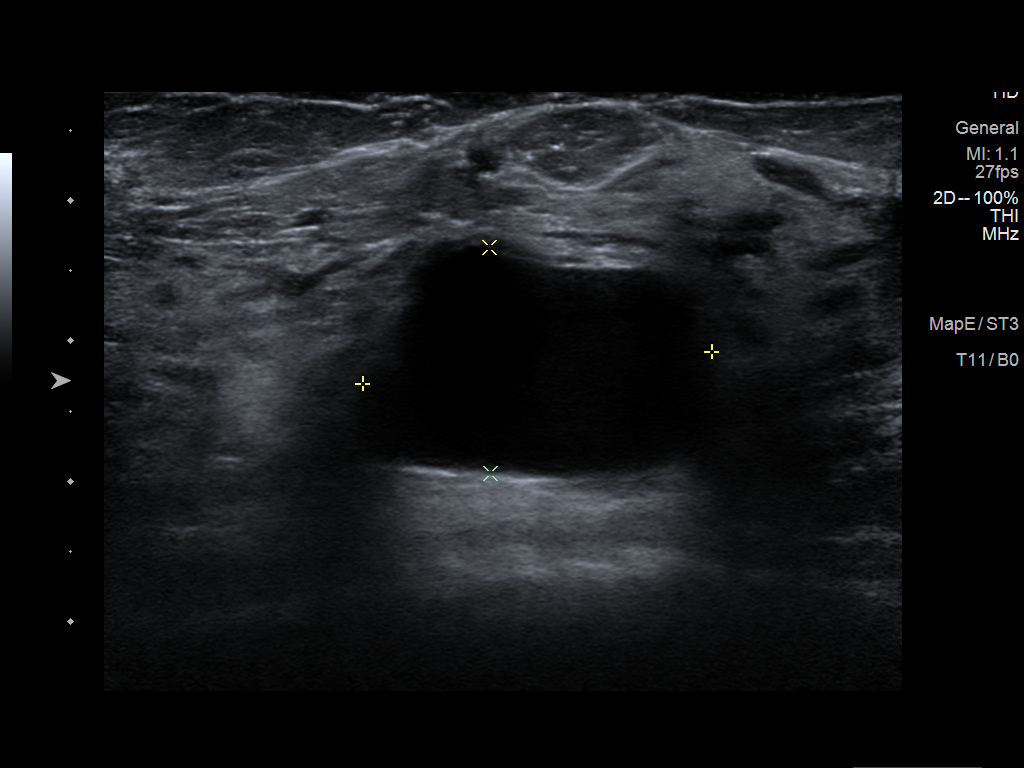
[im 3/5]
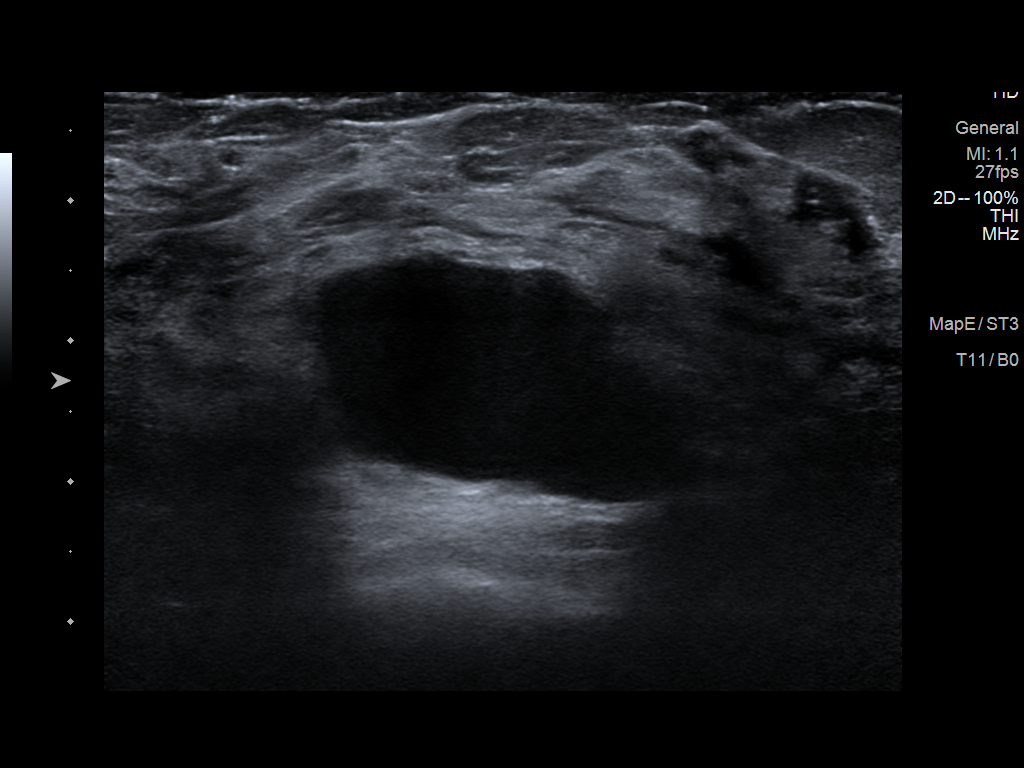
[im 4/5]
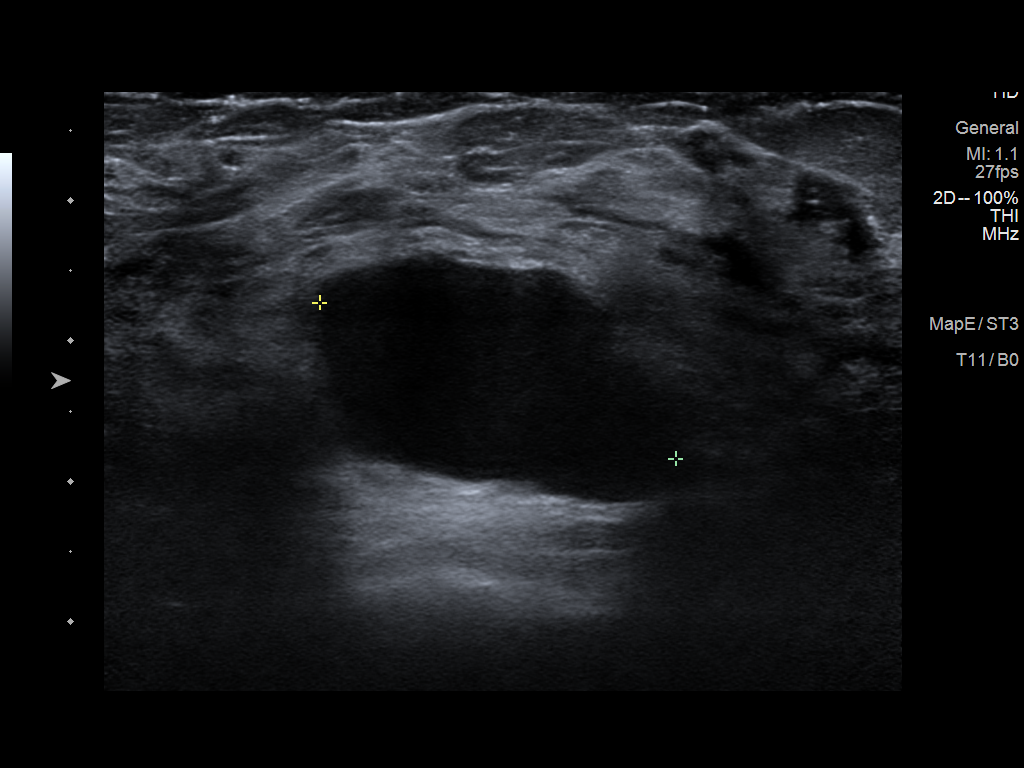
[im 5/5]
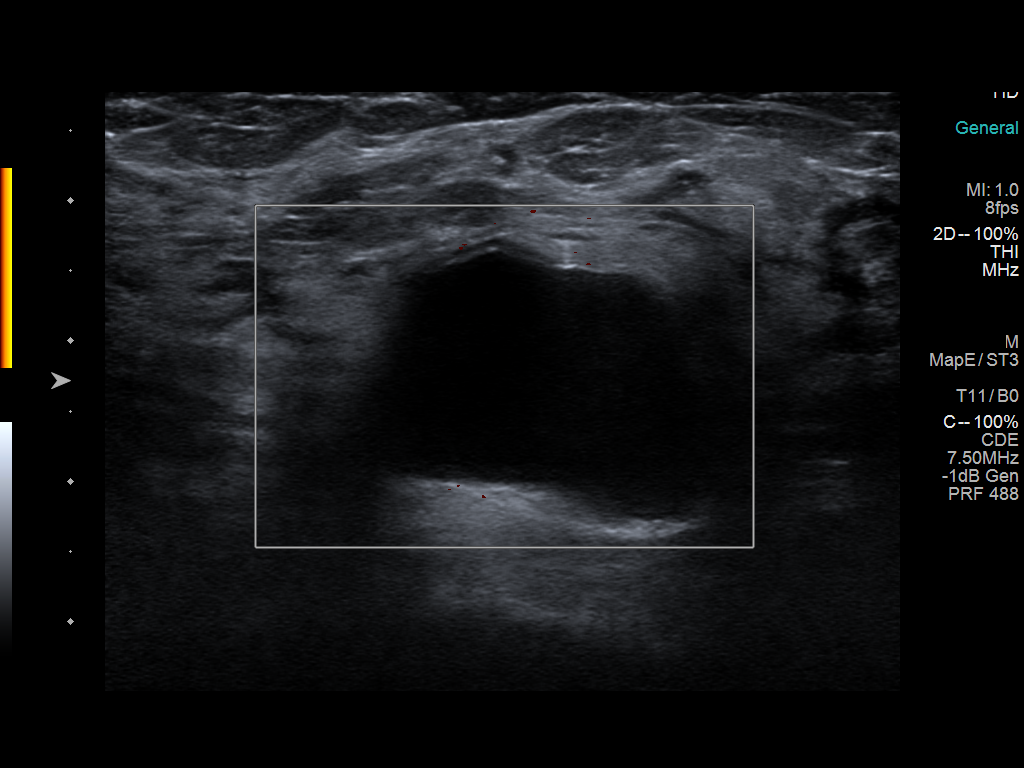

[5 of 5 positions shown; findings below may reference images not displayed]

ACR Breast Density Category c: The breast tissue is heterogeneously
dense, which may obscure small masses.
FINDINGS: There is a circumscribed oval mass in the inferior right breast.
Spot compression view of the outer right breast shows no persistent
asymmetry.

Mammographic images were processed with CAD.

Targeted ultrasound is performed, showing a 2.8 x 2.5 x 1.6 cm
simple cyst in the right breast 7 o'clock position 3 cm from the
nipple.
IMPRESSION: Benign 2.8 cm simple cyst 7 o'clock position right breast. No
evidence of malignancy.

RECOMMENDATION:
Screening mammogram in one year.(Code:9M-E-O0Q)

I have discussed the findings and recommendations with the patient.
Results were also provided in writing at the conclusion of the
visit. If applicable, a reminder letter will be sent to the patient
regarding the next appointment.

BI-RADS CATEGORY  2: Benign.

## 2020-09-02 IMAGING — MG DIGITAL DIAGNOSTIC UNILATERAL RIGHT MAMMOGRAM WITH TOMO AND CAD
6 series · 6 of 18 positions shown · non-contrast
Comparison: March 17, 2018

CLINICAL DATA: 68-year-old patient recalled from recent screening
mammogram for evaluation possible mass and an asymmetry in the right
breast.

EXAM:
DIGITAL DIAGNOSTIC RIGHT MAMMOGRAM WITH CAD AND TOMO
ULTRASOUND RIGHT BREAST

[R CC synth-2D (1 of 2)]
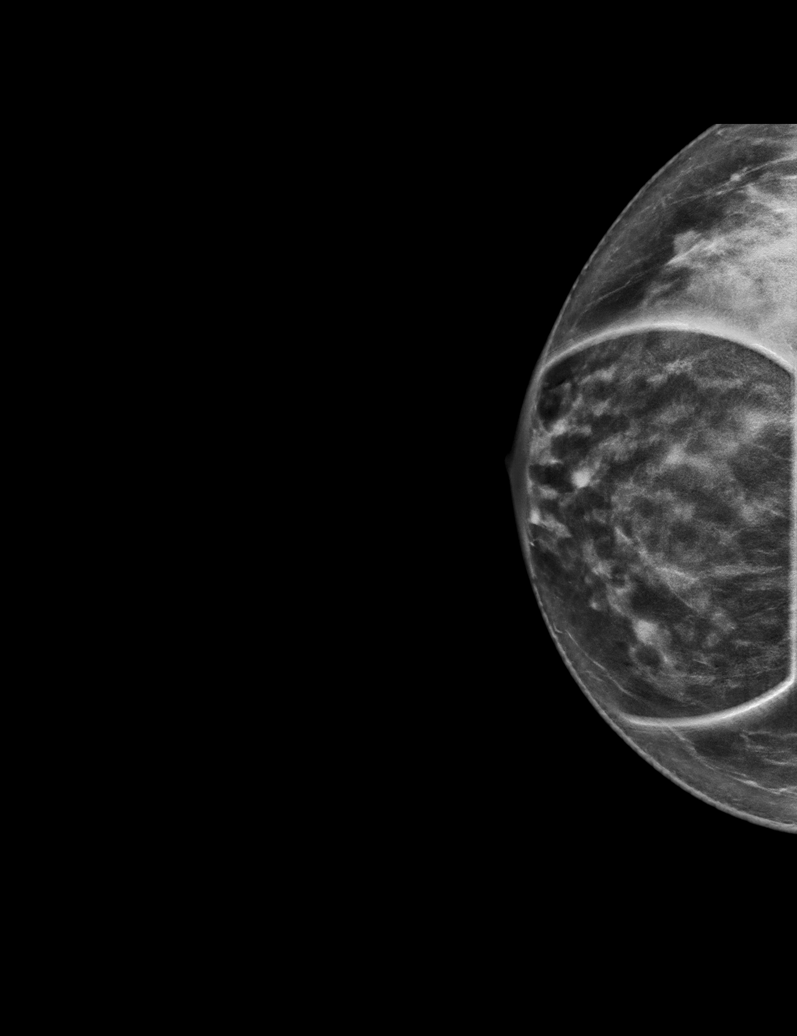

[R CC synth-2D (2 of 2)]
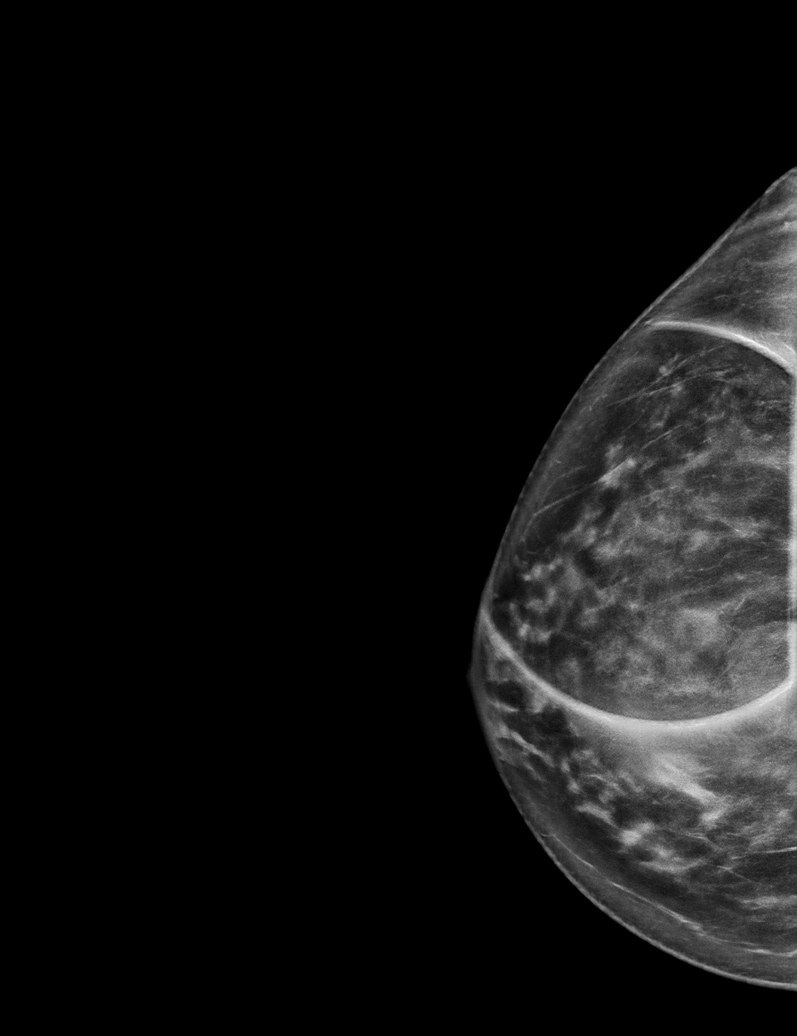

[R MLO synth-2D]
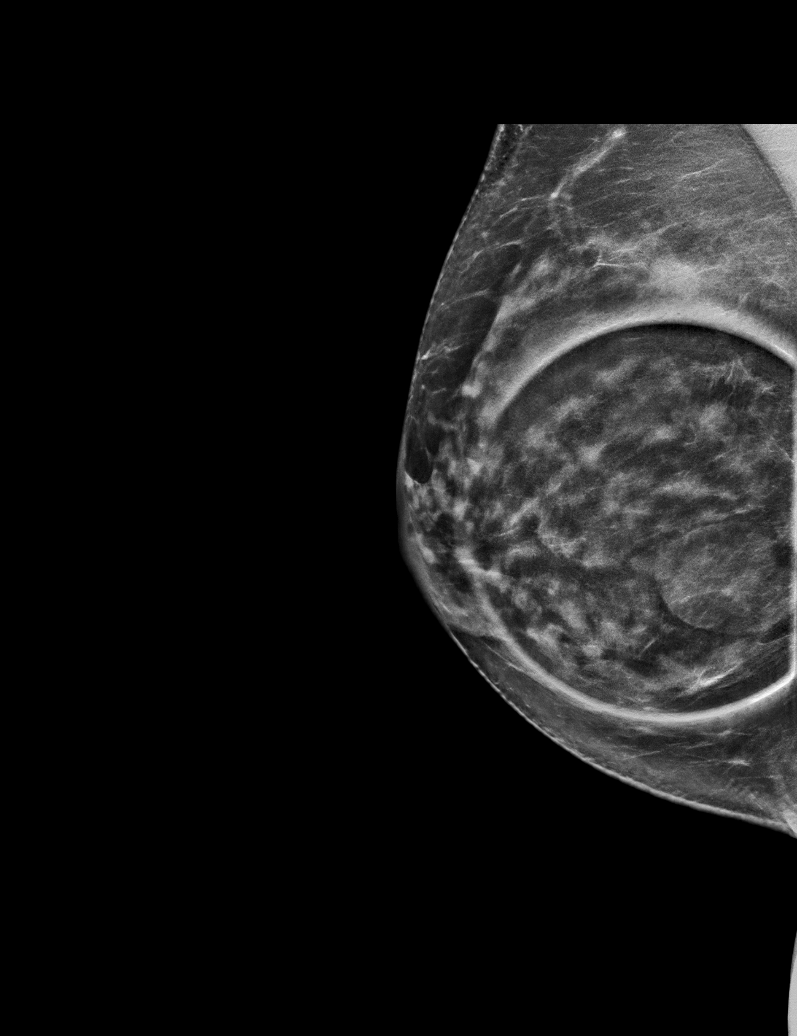

[R CC tomo (1 of 2) · tomo slice 35/70.0]
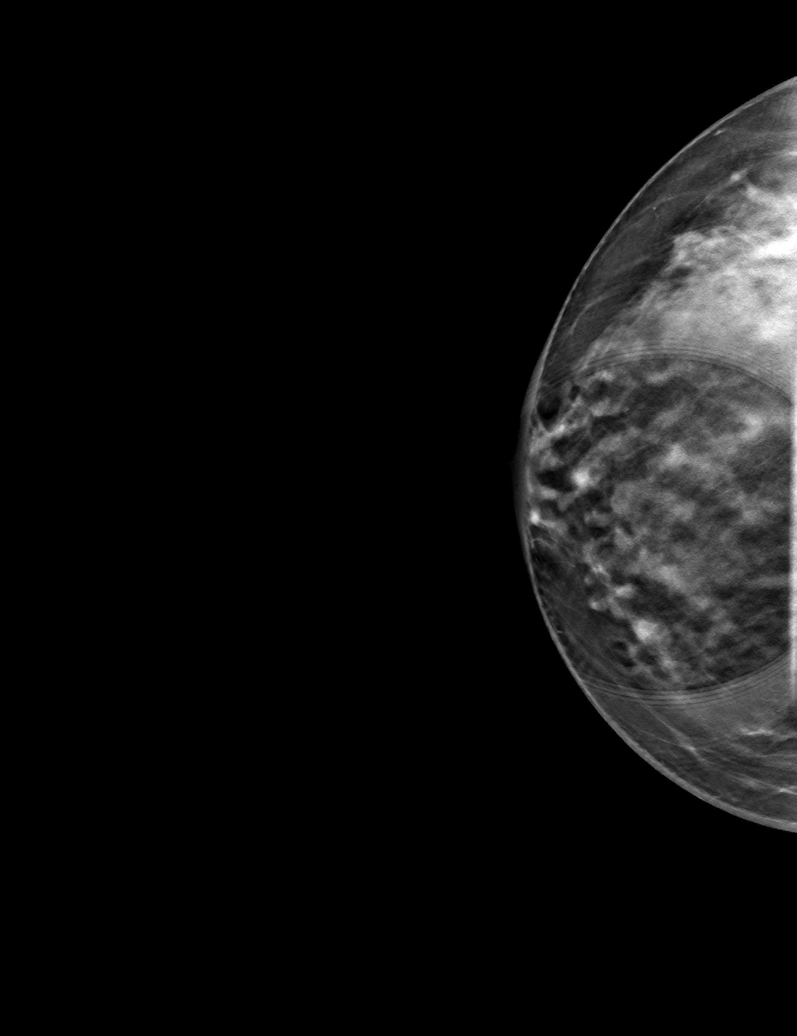

[R MLO tomo · tomo slice 37/72.0]
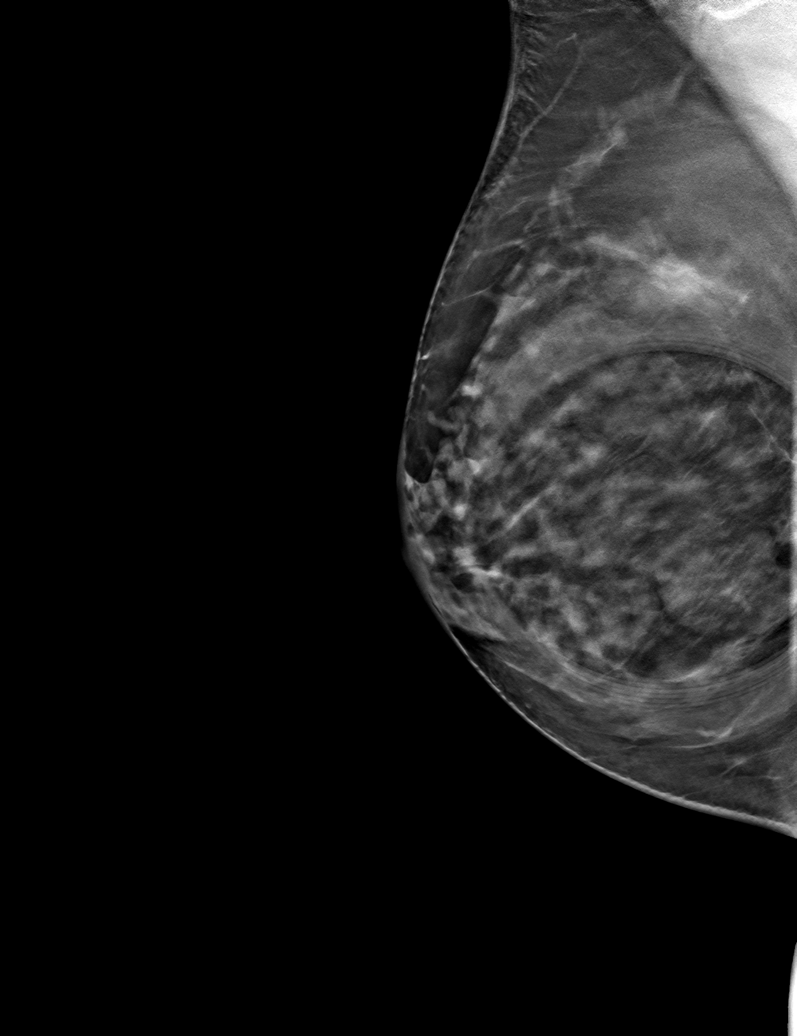

[R CC tomo (2 of 2) · tomo slice 39/76.0]
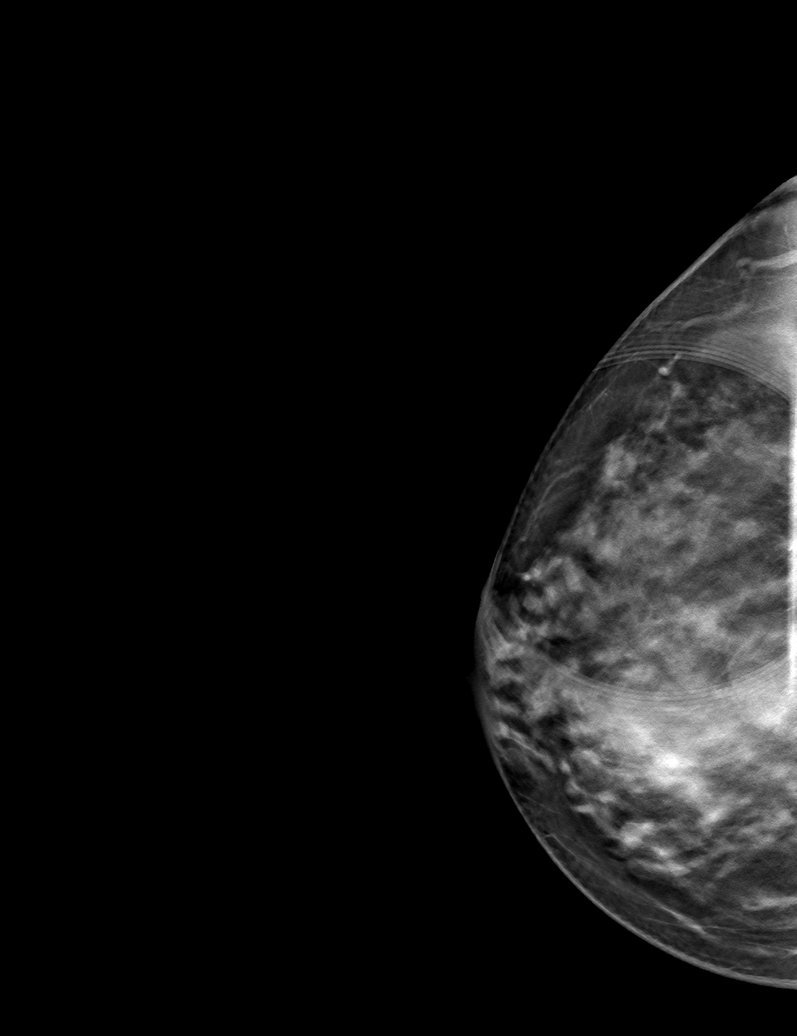

[6 of 18 positions shown; findings below may reference images not displayed]

ACR Breast Density Category c: The breast tissue is heterogeneously
dense, which may obscure small masses.
FINDINGS: There is a circumscribed oval mass in the inferior right breast.
Spot compression view of the outer right breast shows no persistent
asymmetry.

Mammographic images were processed with CAD.

Targeted ultrasound is performed, showing a 2.8 x 2.5 x 1.6 cm
simple cyst in the right breast 7 o'clock position 3 cm from the
nipple.
IMPRESSION: Benign 2.8 cm simple cyst 7 o'clock position right breast. No
evidence of malignancy.

RECOMMENDATION:
Screening mammogram in one year.(Code:9M-E-O0Q)

I have discussed the findings and recommendations with the patient.
Results were also provided in writing at the conclusion of the
visit. If applicable, a reminder letter will be sent to the patient
regarding the next appointment.

BI-RADS CATEGORY  2: Benign.

## 2020-09-10 DIAGNOSIS — R2681 Unsteadiness on feet: Secondary | ICD-10-CM | POA: Diagnosis not present

## 2020-09-10 DIAGNOSIS — M545 Low back pain, unspecified: Secondary | ICD-10-CM | POA: Diagnosis not present

## 2020-09-10 DIAGNOSIS — M6281 Muscle weakness (generalized): Secondary | ICD-10-CM | POA: Diagnosis not present

## 2020-09-10 DIAGNOSIS — M25551 Pain in right hip: Secondary | ICD-10-CM | POA: Diagnosis not present

## 2020-09-16 DIAGNOSIS — R2681 Unsteadiness on feet: Secondary | ICD-10-CM | POA: Diagnosis not present

## 2020-09-16 DIAGNOSIS — M25551 Pain in right hip: Secondary | ICD-10-CM | POA: Diagnosis not present

## 2020-09-16 DIAGNOSIS — M6281 Muscle weakness (generalized): Secondary | ICD-10-CM | POA: Diagnosis not present

## 2020-09-16 DIAGNOSIS — M545 Low back pain, unspecified: Secondary | ICD-10-CM | POA: Diagnosis not present

## 2020-09-23 DIAGNOSIS — M545 Low back pain, unspecified: Secondary | ICD-10-CM | POA: Diagnosis not present

## 2020-09-23 DIAGNOSIS — M6281 Muscle weakness (generalized): Secondary | ICD-10-CM | POA: Diagnosis not present

## 2020-09-23 DIAGNOSIS — R2681 Unsteadiness on feet: Secondary | ICD-10-CM | POA: Diagnosis not present

## 2020-09-23 DIAGNOSIS — M25551 Pain in right hip: Secondary | ICD-10-CM | POA: Diagnosis not present

## 2020-09-29 DIAGNOSIS — M6281 Muscle weakness (generalized): Secondary | ICD-10-CM | POA: Diagnosis not present

## 2020-09-29 DIAGNOSIS — M25551 Pain in right hip: Secondary | ICD-10-CM | POA: Diagnosis not present

## 2020-09-29 DIAGNOSIS — R2681 Unsteadiness on feet: Secondary | ICD-10-CM | POA: Diagnosis not present

## 2020-09-29 DIAGNOSIS — M545 Low back pain, unspecified: Secondary | ICD-10-CM | POA: Diagnosis not present

## 2020-11-10 DIAGNOSIS — E039 Hypothyroidism, unspecified: Secondary | ICD-10-CM | POA: Diagnosis not present

## 2020-11-10 DIAGNOSIS — R7301 Impaired fasting glucose: Secondary | ICD-10-CM | POA: Diagnosis not present

## 2020-11-10 DIAGNOSIS — M859 Disorder of bone density and structure, unspecified: Secondary | ICD-10-CM | POA: Diagnosis not present

## 2020-11-10 DIAGNOSIS — I10 Essential (primary) hypertension: Secondary | ICD-10-CM | POA: Diagnosis not present

## 2020-11-10 DIAGNOSIS — E785 Hyperlipidemia, unspecified: Secondary | ICD-10-CM | POA: Diagnosis not present

## 2020-11-17 DIAGNOSIS — Z0000 Encounter for general adult medical examination without abnormal findings: Secondary | ICD-10-CM | POA: Diagnosis not present

## 2020-11-17 DIAGNOSIS — E785 Hyperlipidemia, unspecified: Secondary | ICD-10-CM | POA: Diagnosis not present

## 2020-11-17 DIAGNOSIS — R569 Unspecified convulsions: Secondary | ICD-10-CM | POA: Diagnosis not present

## 2020-11-17 DIAGNOSIS — R0609 Other forms of dyspnea: Secondary | ICD-10-CM | POA: Diagnosis not present

## 2020-11-17 DIAGNOSIS — Z1331 Encounter for screening for depression: Secondary | ICD-10-CM | POA: Diagnosis not present

## 2020-11-17 DIAGNOSIS — I519 Heart disease, unspecified: Secondary | ICD-10-CM | POA: Diagnosis not present

## 2020-11-17 DIAGNOSIS — N200 Calculus of kidney: Secondary | ICD-10-CM | POA: Diagnosis not present

## 2020-11-17 DIAGNOSIS — R5382 Chronic fatigue, unspecified: Secondary | ICD-10-CM | POA: Diagnosis not present

## 2020-11-17 DIAGNOSIS — M8580 Other specified disorders of bone density and structure, unspecified site: Secondary | ICD-10-CM | POA: Diagnosis not present

## 2020-11-17 DIAGNOSIS — R82998 Other abnormal findings in urine: Secondary | ICD-10-CM | POA: Diagnosis not present

## 2020-11-17 DIAGNOSIS — E039 Hypothyroidism, unspecified: Secondary | ICD-10-CM | POA: Diagnosis not present

## 2020-11-17 DIAGNOSIS — I10 Essential (primary) hypertension: Secondary | ICD-10-CM | POA: Diagnosis not present

## 2020-11-17 DIAGNOSIS — R7301 Impaired fasting glucose: Secondary | ICD-10-CM | POA: Diagnosis not present

## 2020-11-19 ENCOUNTER — Other Ambulatory Visit: Payer: Self-pay

## 2020-11-19 DIAGNOSIS — I10 Essential (primary) hypertension: Secondary | ICD-10-CM

## 2020-11-24 DIAGNOSIS — Z1212 Encounter for screening for malignant neoplasm of rectum: Secondary | ICD-10-CM | POA: Diagnosis not present

## 2020-12-02 DIAGNOSIS — J0100 Acute maxillary sinusitis, unspecified: Secondary | ICD-10-CM | POA: Diagnosis not present

## 2020-12-02 DIAGNOSIS — Z1152 Encounter for screening for COVID-19: Secondary | ICD-10-CM | POA: Diagnosis not present

## 2020-12-02 DIAGNOSIS — R519 Headache, unspecified: Secondary | ICD-10-CM | POA: Diagnosis not present

## 2020-12-02 DIAGNOSIS — J029 Acute pharyngitis, unspecified: Secondary | ICD-10-CM | POA: Diagnosis not present

## 2020-12-02 DIAGNOSIS — R0981 Nasal congestion: Secondary | ICD-10-CM | POA: Diagnosis not present

## 2020-12-02 DIAGNOSIS — R058 Other specified cough: Secondary | ICD-10-CM | POA: Diagnosis not present

## 2021-01-02 DIAGNOSIS — M9904 Segmental and somatic dysfunction of sacral region: Secondary | ICD-10-CM | POA: Diagnosis not present

## 2021-01-02 DIAGNOSIS — M9905 Segmental and somatic dysfunction of pelvic region: Secondary | ICD-10-CM | POA: Diagnosis not present

## 2021-01-02 DIAGNOSIS — M9903 Segmental and somatic dysfunction of lumbar region: Secondary | ICD-10-CM | POA: Diagnosis not present

## 2021-01-02 DIAGNOSIS — M9901 Segmental and somatic dysfunction of cervical region: Secondary | ICD-10-CM | POA: Diagnosis not present

## 2021-01-08 ENCOUNTER — Other Ambulatory Visit: Payer: Self-pay

## 2021-01-08 NOTE — Progress Notes (Signed)
Erroneous encounter

## 2021-01-21 ENCOUNTER — Telehealth: Payer: Self-pay

## 2021-01-21 NOTE — Telephone Encounter (Signed)
Pt called and stated that they were ahving issues getting the repatha that it was cost prohibitive so I got them cost assistance and the pt voiced gratitude and understanding.  [employer] [facility] information provided to the pt  Pharmacy Card CARD NO. [id number]   CARD STATUS Active   BIN [id number]   PCN [id number]   PC GROUP [id number]   HELP DESK [phone]   PROVIDER PDMI   PROCESSOR PDMI

## 2021-03-04 ENCOUNTER — Other Ambulatory Visit: Payer: Self-pay

## 2021-03-04 ENCOUNTER — Ambulatory Visit: Payer: PPO | Admitting: Cardiovascular Disease

## 2021-03-04 ENCOUNTER — Encounter: Payer: Self-pay | Admitting: Cardiovascular Disease

## 2021-03-04 VITALS — BP 124/86 | HR 74 | Ht 64.0 in | Wt 147.8 lb

## 2021-03-04 DIAGNOSIS — M79604 Pain in right leg: Secondary | ICD-10-CM | POA: Diagnosis not present

## 2021-03-04 DIAGNOSIS — M79606 Pain in leg, unspecified: Secondary | ICD-10-CM | POA: Insufficient documentation

## 2021-03-04 DIAGNOSIS — I7121 Aneurysm of the ascending aorta, without rupture: Secondary | ICD-10-CM | POA: Diagnosis not present

## 2021-03-04 DIAGNOSIS — M79605 Pain in left leg: Secondary | ICD-10-CM | POA: Diagnosis not present

## 2021-03-04 DIAGNOSIS — E782 Mixed hyperlipidemia: Secondary | ICD-10-CM | POA: Diagnosis not present

## 2021-03-04 DIAGNOSIS — R0609 Other forms of dyspnea: Secondary | ICD-10-CM | POA: Diagnosis not present

## 2021-03-04 NOTE — Assessment & Plan Note (Signed)
History of dyslipidemia on on exertion with past 2D echoes that are essentially normal except for some mild diastolic dysfunction.  Her coronary calcium score was 7 and a Myoview stress test likewise was nonischemic. ?

## 2021-03-04 NOTE — Assessment & Plan Note (Signed)
History of essential hypertension blood pressure measured today 150/72.  She is not on antihypertensive medications. ?

## 2021-03-04 NOTE — Progress Notes (Signed)
? ? ? ?03/04/2021 ?Jean Scott   ?1950/03/28  ?[id number] ? ?Primary Physician Prince Solian, MD ?Primary Cardiologist: Lorretta Harp MD Lupe Carney, [place] ? ?HPI:  Jean Scott is a 71 y.o.  mildly overweight married Caucasian female with no children whose husband Jean Scott is also a patient of mine.  She was referred by Dr. Dagmar Hait for cardiovascular valuation because of dyspnea.    I last saw her in the office 06/08/2019.  Her cardiac risk factors are notable for untreated hyperlipidemia.  She is hypertensive today but normally she is not nor she had medicines for this.  She has never smoked.  There is no family history of heart disease.  She is never had a heart attack or stroke.  She denies chest pain.  She does have chronic fatigue syndrome, fibromyalgia and seizure disorder.  She had a 2D echocardiogram performed because of dyspnea and murmur 05/15/2015 which was essentially normal.  She complains of shortness of breath on minimal activity. ?  ?She had a 2D echo 03/07/2018 which was essentially normal except for some diastolic dysfunction and a thoracic aorta which was generous.  Follow-up chest CTA performed 06/07/2018 revealed this to be about 4 cm.  The coronary calcium score is measured at 7 making it unlikely that she has CAD although she continues to complain of dyspnea without a clear etiology.  In addition, she has recently become hypertensive and was put on short acting diltiazem with a blood pressure log at home showing borderline hypertensive readings.  Her lipid profile had been significantly elevated as well. ?  ?We adjusted her antihypertensive medications and consolidated her diltiazem.  Blood pressures been under better control.  She was placed on Repatha with an incredible response, lipid profile performed 01/30/2019 revealed total cholesterol 135, LDL 58 and HDL 48.  She continues to have fatigue and dyspnea on exertion.  Her 2D echo was essentially normal except for grade 1 diastolic  dysfunction and a Myoview was normal as well.  Chest CT did show a 4 cm ascending thoracic aortic aneurysm. ? ?Since I saw her a year and a half ago her major complaint today is of pain in her legs.  This began after falling off a vanity stool in her bathroom this past fall.  She has pain at rest both lying sitting and with ambulation.  Her symptoms do not sound vascular in nature. ? ? ?Current Meds  ?Medication Sig  ? amphetamine-dextroamphetamine (ADDERALL) 20 MG tablet Take 20 mg in the morning and 10 mg at lunch  ? anti-nausea (EMETROL) solution Take 10 mLs by mouth 2 (two) times daily.  ? Cholecalciferol (VITAMIN D3) 2000 units TABS Take 2,000 Units by mouth daily.  ? clonazePAM (KLONOPIN) 1 MG tablet Take .75 mg at bedtime and .25 mg in the morning  ? conjugated estrogens (PREMARIN) vaginal cream Place 1 Applicatorful vaginally daily.  ? diphenhydrAMINE-APAP, sleep, 38-500 MG TABS Take by mouth at bedtime.   ? estradiol (ESTRACE) 0.5 MG tablet Take 1 tablet (0.5 mg total) by mouth daily.  ? fluticasone (FLONASE) 50 MCG/ACT nasal spray Place 1 spray into both nostrils daily.  ? Ibuprofen 200 MG CAPS Take by mouth.  ? levothyroxine (SYNTHROID, LEVOTHROID) 88 MCG tablet Take 88 mcg by mouth daily before breakfast.  ? Melatonin 2.5 MG CAPS Take 2.5 mg by mouth.  ? mometasone (ELOCON) 0.1 % cream   ? Omega-3 Fatty Acids (FISH OIL PO) Take 2 capsules by mouth daily.  ?  omeprazole (PRILOSEC OTC) 20 MG tablet Take 20 mg by mouth as needed.  ? Probiotic Product (PROBIOTIC DAILY PO) Take 1 tablet by mouth daily.  ? Red Yeast Rice Extract (RED YEAST RICE PO) Take 1 capsule by mouth daily.  ? REPATHA SURECLICK 580 MG/ML SOAJ ADMINISTER 1 ML UNDER THE SKIN EVERY 14 DAYS  ?  ? ?Allergies  ?Allergen Reactions  ? Sulfamethoxazole-Trimethoprim Other (See Comments)  ?  Anxiety, jittery  ? Alprazolam Other (See Comments)  ?  Get too groggy can't function  ? Chlorthalidone   ?  Constipation ?  ? Ciprofloxacin   ?  Pt gets  dehydrated  ? Epinephrine   ?  Makes pt feel like they are coming out of their skin  ? Potassium Citrate   ?  Constipation; "CSF crash so I wasn't functioning well at all"  ? ? ?Social History  ? ?Socioeconomic History  ? Marital status: Married  ?  Spouse name: Not on file  ? Number of children: 0  ? Years of education: Not on file  ? Highest education level: Not on file  ?Occupational History  ? Occupation: Disabled  ?Tobacco Use  ? Smoking status: Never  ? Smokeless tobacco: Never  ?Vaping Use  ? Vaping Use: Never used  ?Substance and Sexual Activity  ? Alcohol use: No  ? Drug use: No  ? Sexual activity: Not on file  ?Other Topics Concern  ? Not on file  ?Social History Narrative  ? Not on file  ? ?Social Determinants of Health  ? ?Financial Resource Strain: Not on file  ?Food Insecurity: Not on file  ?Transportation Needs: Not on file  ?Physical Activity: Not on file  ?Stress: Not on file  ?Social Connections: Not on file  ?Intimate Partner Violence: Not on file  ?  ? ?Review of Systems: ?General: negative for chills, fever, night sweats or weight changes.  ?Cardiovascular: negative for chest pain, dyspnea on exertion, edema, orthopnea, palpitations, paroxysmal nocturnal dyspnea or shortness of breath ?Dermatological: negative for rash ?Respiratory: negative for cough or wheezing ?Urologic: negative for hematuria ?Abdominal: negative for nausea, vomiting, diarrhea, bright red blood per rectum, melena, or hematemesis ?Neurologic: negative for visual changes, syncope, or dizziness ?All other systems reviewed and are otherwise negative except as noted above. ? ? ? ?Blood pressure (!) 150/72, pulse 74, height 5\' 4"  (1.626 m), weight 147 lb 12.8 oz (67 kg), SpO2 99 %.  ?General appearance: alert and no distress ?Neck: no adenopathy, no carotid bruit, no JVD, supple, symmetrical, trachea midline, and thyroid not enlarged, symmetric, no tenderness/mass/nodules ?Lungs: clear to auscultation bilaterally ?Heart: regular  rate and rhythm, S1, S2 normal, no murmur, click, rub or gallop ?Extremities: extremities normal, atraumatic, no cyanosis or edema ?Pulses: 2+ and symmetric ?Skin: Skin color, texture, turgor normal. No rashes or lesions ?Neurologic: Grossly normal ? ?EKG sinus rhythm at 74 with PACs and septal Q waves.  I personally reviewed this EKG. ? ?ASSESSMENT AND PLAN:  ? ?Dyspnea on exertion ?History of dyslipidemia on on exertion with past 2D echoes that are essentially normal except for some mild diastolic dysfunction.  Her coronary calcium score was 7 and a Myoview stress test likewise was nonischemic. ? ?Hyperlipidemia ?History of hyperlipidemia on Repatha lipid profile performed 11/10/2020 revealing total cholesterol of 123, LDL 38 and HDL 48. ? ?Essential hypertension ?History of essential hypertension blood pressure measured today 150/72.  She is not on antihypertensive medications. ? ?Thoracic aortic aneurysm ([place]) ?History of small thoracic  aortic aneurysm measuring 4 cm last checked 08/01/2020 by CTA which had not changed compared to prior measurements.  We will recheck this coming July. ? ?Leg pain ?Complaints of bilateral leg pain after falling off a vanity stool in her bathroom this past fall and landing on a ceramic floor.  She has pain sitting lying and standing.  She has 2+ pedal pulses.  I suspect that the pain is not vascular but more likely orthopedic and/or neurologic.  We will get ABIs to further evaluate. ? ? ? ? ?Lorretta Harp MD FACP,FACC,FAHA, FSCAI ?03/04/2021 ?2:46 PM ?

## 2021-03-04 NOTE — Assessment & Plan Note (Signed)
History of small thoracic aortic aneurysm measuring 4 cm last checked 08/01/2020 by CTA which had not changed compared to prior measurements.  We will recheck this coming July. ?

## 2021-03-04 NOTE — Assessment & Plan Note (Signed)
History of hyperlipidemia on Repatha lipid profile performed 11/10/2020 revealing total cholesterol of 123, LDL 38 and HDL 48. ?

## 2021-03-04 NOTE — Patient Instructions (Signed)
Medication Instructions:  ?Your physician recommends that you continue on your current medications as directed. Please refer to the Current Medication list given to you today. ? ?*If you need a refill on your cardiac medications before your next appointment, please call your pharmacy* ? ? ?Testing/Procedures: ?Your physician has requested that you have an ankle brachial index (ABI). During this test an ultrasound and blood pressure cuff are used to evaluate the arteries that supply the arms and legs with blood. Allow thirty minutes for this exam. There are no restrictions or special instructions. This procedure will be done at [place]. [address] ? ? ? ?Follow-Up: ?At [facility], you and your health needs are our priority.  As part of our continuing mission to provide you with exceptional heart care, we have created designated Provider Care Teams.  These Care Teams include your primary Cardiologist (physician) and Advanced Practice Providers (APPs -  Physician Assistants and Nurse Practitioners) who all work together to provide you with the care you need, when you need it. ? ?We recommend signing up for the patient portal called "MyChart".  Sign up information is provided on this After Visit Summary.  MyChart is used to connect with patients for Virtual Visits (Telemedicine).  Patients are able to view lab/test results, encounter notes, upcoming appointments, etc.  Non-urgent messages can be sent to your provider as well.   ?To learn more about what you can do with MyChart, go to [email or web address].   ? ?Your next appointment:   ?12 month(s) ? ?The format for your next appointment:   ?In Person ? ?Provider:   ?Quay Burow, MD  ?

## 2021-03-04 NOTE — Assessment & Plan Note (Signed)
Complaints of bilateral leg pain after falling off a vanity stool in her bathroom this past fall and landing on a ceramic floor.  She has pain sitting lying and standing.  She has 2+ pedal pulses.  I suspect that the pain is not vascular but more likely orthopedic and/or neurologic.  We will get ABIs to further evaluate. ?

## 2021-03-06 ENCOUNTER — Other Ambulatory Visit: Payer: Self-pay

## 2021-03-06 ENCOUNTER — Ambulatory Visit (HOSPITAL_COMMUNITY)
Admission: RE | Admit: 2021-03-06 | Discharge: 2021-03-06 | Disposition: A | Discharge status: Home / Self Care | Payer: PPO | Source: Ambulatory Visit | Attending: Cardiology | Admitting: Cardiology

## 2021-03-06 DIAGNOSIS — M79604 Pain in right leg: Secondary | ICD-10-CM | POA: Diagnosis not present

## 2021-03-06 DIAGNOSIS — M79605 Pain in left leg: Secondary | ICD-10-CM | POA: Diagnosis not present

## 2021-03-10 DIAGNOSIS — R531 Weakness: Secondary | ICD-10-CM | POA: Diagnosis not present

## 2021-03-10 DIAGNOSIS — R059 Cough, unspecified: Secondary | ICD-10-CM | POA: Diagnosis not present

## 2021-03-10 DIAGNOSIS — Z1152 Encounter for screening for COVID-19: Secondary | ICD-10-CM | POA: Diagnosis not present

## 2021-03-10 DIAGNOSIS — H65192 Other acute nonsuppurative otitis media, left ear: Secondary | ICD-10-CM | POA: Diagnosis not present

## 2021-03-10 DIAGNOSIS — J060 Acute laryngopharyngitis: Secondary | ICD-10-CM | POA: Diagnosis not present

## 2021-03-10 DIAGNOSIS — R0981 Nasal congestion: Secondary | ICD-10-CM | POA: Diagnosis not present

## 2021-03-17 DIAGNOSIS — R490 Dysphonia: Secondary | ICD-10-CM | POA: Diagnosis not present

## 2021-03-17 DIAGNOSIS — J040 Acute laryngitis: Secondary | ICD-10-CM | POA: Diagnosis not present

## 2021-03-17 DIAGNOSIS — R491 Aphonia: Secondary | ICD-10-CM | POA: Diagnosis not present

## 2021-04-20 ENCOUNTER — Encounter: Payer: Self-pay | Admitting: Neurology

## 2021-04-24 DIAGNOSIS — N3020 Other chronic cystitis without hematuria: Secondary | ICD-10-CM | POA: Diagnosis not present

## 2021-04-24 DIAGNOSIS — N952 Postmenopausal atrophic vaginitis: Secondary | ICD-10-CM | POA: Diagnosis not present

## 2021-04-24 DIAGNOSIS — N200 Calculus of kidney: Secondary | ICD-10-CM | POA: Diagnosis not present

## 2021-05-05 DIAGNOSIS — N200 Calculus of kidney: Secondary | ICD-10-CM | POA: Diagnosis not present

## 2021-05-05 DIAGNOSIS — I700 Atherosclerosis of aorta: Secondary | ICD-10-CM | POA: Diagnosis not present

## 2021-05-05 DIAGNOSIS — N132 Hydronephrosis with renal and ureteral calculous obstruction: Secondary | ICD-10-CM | POA: Diagnosis not present

## 2021-05-05 DIAGNOSIS — Z90710 Acquired absence of both cervix and uterus: Secondary | ICD-10-CM | POA: Diagnosis not present

## 2021-05-05 DIAGNOSIS — Q631 Lobulated, fused and horseshoe kidney: Secondary | ICD-10-CM | POA: Diagnosis not present

## 2021-05-14 ENCOUNTER — Other Ambulatory Visit: Payer: Self-pay

## 2021-05-14 MED ORDER — REPATHA SURECLICK 140 MG/ML ~~LOC~~ SOAJ: SUBCUTANEOUS | 3 refills | Status: DC

## 2021-05-21 DIAGNOSIS — E785 Hyperlipidemia, unspecified: Secondary | ICD-10-CM | POA: Diagnosis not present

## 2021-05-21 DIAGNOSIS — I519 Heart disease, unspecified: Secondary | ICD-10-CM | POA: Diagnosis not present

## 2021-05-21 DIAGNOSIS — G629 Polyneuropathy, unspecified: Secondary | ICD-10-CM | POA: Diagnosis not present

## 2021-05-21 DIAGNOSIS — I712 Thoracic aortic aneurysm, without rupture, unspecified: Secondary | ICD-10-CM | POA: Diagnosis not present

## 2021-05-21 DIAGNOSIS — M8580 Other specified disorders of bone density and structure, unspecified site: Secondary | ICD-10-CM | POA: Diagnosis not present

## 2021-05-21 DIAGNOSIS — E790 Hyperuricemia without signs of inflammatory arthritis and tophaceous disease: Secondary | ICD-10-CM | POA: Diagnosis not present

## 2021-05-21 DIAGNOSIS — R5382 Chronic fatigue, unspecified: Secondary | ICD-10-CM | POA: Diagnosis not present

## 2021-05-21 DIAGNOSIS — K5900 Constipation, unspecified: Secondary | ICD-10-CM | POA: Diagnosis not present

## 2021-05-21 DIAGNOSIS — I10 Essential (primary) hypertension: Secondary | ICD-10-CM | POA: Diagnosis not present

## 2021-05-21 DIAGNOSIS — R7301 Impaired fasting glucose: Secondary | ICD-10-CM | POA: Diagnosis not present

## 2021-05-21 DIAGNOSIS — N200 Calculus of kidney: Secondary | ICD-10-CM | POA: Diagnosis not present

## 2021-06-08 ENCOUNTER — Other Ambulatory Visit: Payer: Self-pay

## 2021-06-08 MED ORDER — REPATHA SURECLICK 140 MG/ML ~~LOC~~ SOAJ: SUBCUTANEOUS | 11 refills | Status: DC

## 2021-06-22 ENCOUNTER — Other Ambulatory Visit: Payer: Self-pay

## 2021-06-22 DIAGNOSIS — I10 Essential (primary) hypertension: Secondary | ICD-10-CM | POA: Diagnosis not present

## 2021-06-23 LAB — BASIC METABOLIC PANEL
BUN/Creatinine Ratio: 22 (ref 12–28)
BUN: 15 mg/dL (ref 8–27)
CO2: 22 mmol/L (ref 20–29)
Calcium: 10.5 mg/dL — ABNORMAL HIGH (ref 8.7–10.3)
Chloride: 102 mmol/L (ref 96–106)
Creatinine, Ser: 0.69 mg/dL (ref 0.57–1.00)
Glucose: 107 mg/dL — ABNORMAL HIGH (ref 70–99)
Potassium: 4.5 mmol/L (ref 3.5–5.2)
Sodium: 142 mmol/L (ref 134–144)
eGFR: 93 mL/min/{1.73_m2} (ref >59)

## 2021-06-26 ENCOUNTER — Ambulatory Visit (HOSPITAL_BASED_OUTPATIENT_CLINIC_OR_DEPARTMENT_OTHER)
Admission: RE | Admit: 2021-06-26 | Discharge: 2021-06-26 | Disposition: A | Discharge status: Home / Self Care | Payer: PPO | Source: Ambulatory Visit | Attending: Cardiovascular Disease | Admitting: Cardiovascular Disease

## 2021-06-26 DIAGNOSIS — I712 Thoracic aortic aneurysm, without rupture, unspecified: Secondary | ICD-10-CM | POA: Insufficient documentation

## 2021-06-26 MED ORDER — IOHEXOL 350 MG/ML SOLN
100.0000 mL | Freq: Once | INTRAVENOUS | Status: AC | PRN
  Administered 2021-06-26: 100 mL via INTRAVENOUS

## 2021-07-01 ENCOUNTER — Other Ambulatory Visit: Payer: Self-pay

## 2021-07-01 DIAGNOSIS — I7121 Aneurysm of the ascending aorta, without rupture: Secondary | ICD-10-CM

## 2021-07-01 DIAGNOSIS — L814 Other melanin hyperpigmentation: Secondary | ICD-10-CM | POA: Diagnosis not present

## 2021-07-01 DIAGNOSIS — D225 Melanocytic nevi of trunk: Secondary | ICD-10-CM | POA: Diagnosis not present

## 2021-07-01 DIAGNOSIS — L718 Other rosacea: Secondary | ICD-10-CM | POA: Diagnosis not present

## 2021-07-01 DIAGNOSIS — L821 Other seborrheic keratosis: Secondary | ICD-10-CM | POA: Diagnosis not present

## 2021-07-01 DIAGNOSIS — I712 Thoracic aortic aneurysm, without rupture, unspecified: Secondary | ICD-10-CM

## 2021-07-01 DIAGNOSIS — D492 Neoplasm of unspecified behavior of bone, soft tissue, and skin: Secondary | ICD-10-CM | POA: Diagnosis not present

## 2021-07-01 DIAGNOSIS — L570 Actinic keratosis: Secondary | ICD-10-CM | POA: Diagnosis not present

## 2021-07-01 DIAGNOSIS — L439 Lichen planus, unspecified: Secondary | ICD-10-CM | POA: Diagnosis not present

## 2021-07-03 ENCOUNTER — Other Ambulatory Visit: Payer: Self-pay | Admitting: Cardiovascular Disease

## 2021-07-03 ENCOUNTER — Telehealth: Payer: Self-pay

## 2021-07-03 NOTE — Telephone Encounter (Signed)
Please call regarding Repatha refill.  Thank you

## 2021-07-03 NOTE — Telephone Encounter (Signed)
LMOM

## 2021-07-24 ENCOUNTER — Inpatient Hospital Stay: Admission: RE | Admit: 2021-07-24 | Payer: PPO | Source: Ambulatory Visit

## 2021-07-30 ENCOUNTER — Other Ambulatory Visit: Payer: Self-pay | Admitting: Internal Medicine

## 2021-07-30 ENCOUNTER — Other Ambulatory Visit (HOSPITAL_BASED_OUTPATIENT_CLINIC_OR_DEPARTMENT_OTHER): Payer: Self-pay | Admitting: Internal Medicine

## 2021-07-30 ENCOUNTER — Other Ambulatory Visit (HOSPITAL_COMMUNITY): Payer: Self-pay | Admitting: Internal Medicine

## 2021-07-30 DIAGNOSIS — E785 Hyperlipidemia, unspecified: Secondary | ICD-10-CM | POA: Diagnosis not present

## 2021-07-30 DIAGNOSIS — R410 Disorientation, unspecified: Secondary | ICD-10-CM

## 2021-07-30 DIAGNOSIS — G629 Polyneuropathy, unspecified: Secondary | ICD-10-CM | POA: Diagnosis not present

## 2021-07-30 DIAGNOSIS — R7301 Impaired fasting glucose: Secondary | ICD-10-CM | POA: Diagnosis not present

## 2021-07-30 DIAGNOSIS — M8580 Other specified disorders of bone density and structure, unspecified site: Secondary | ICD-10-CM | POA: Diagnosis not present

## 2021-07-30 DIAGNOSIS — I519 Heart disease, unspecified: Secondary | ICD-10-CM | POA: Diagnosis not present

## 2021-07-30 DIAGNOSIS — N390 Urinary tract infection, site not specified: Secondary | ICD-10-CM | POA: Diagnosis not present

## 2021-07-30 DIAGNOSIS — R5382 Chronic fatigue, unspecified: Secondary | ICD-10-CM | POA: Diagnosis not present

## 2021-07-30 DIAGNOSIS — E039 Hypothyroidism, unspecified: Secondary | ICD-10-CM | POA: Diagnosis not present

## 2021-07-30 DIAGNOSIS — I10 Essential (primary) hypertension: Secondary | ICD-10-CM | POA: Diagnosis not present

## 2021-07-30 DIAGNOSIS — I712 Thoracic aortic aneurysm, without rupture, unspecified: Secondary | ICD-10-CM | POA: Diagnosis not present

## 2021-07-30 DIAGNOSIS — E790 Hyperuricemia without signs of inflammatory arthritis and tophaceous disease: Secondary | ICD-10-CM | POA: Diagnosis not present

## 2021-07-31 ENCOUNTER — Ambulatory Visit (HOSPITAL_BASED_OUTPATIENT_CLINIC_OR_DEPARTMENT_OTHER)
Admission: RE | Admit: 2021-07-31 | Discharge: 2021-07-31 | Disposition: A | Discharge status: Home / Self Care | Payer: PPO | Source: Ambulatory Visit | Attending: Internal Medicine | Admitting: Internal Medicine

## 2021-07-31 DIAGNOSIS — R410 Disorientation, unspecified: Secondary | ICD-10-CM | POA: Diagnosis not present

## 2021-08-03 DIAGNOSIS — I519 Heart disease, unspecified: Secondary | ICD-10-CM | POA: Diagnosis not present

## 2021-08-03 DIAGNOSIS — I10 Essential (primary) hypertension: Secondary | ICD-10-CM | POA: Diagnosis not present

## 2021-08-03 DIAGNOSIS — R5382 Chronic fatigue, unspecified: Secondary | ICD-10-CM | POA: Diagnosis not present

## 2021-08-03 DIAGNOSIS — N200 Calculus of kidney: Secondary | ICD-10-CM | POA: Diagnosis not present

## 2021-08-03 DIAGNOSIS — G629 Polyneuropathy, unspecified: Secondary | ICD-10-CM | POA: Diagnosis not present

## 2021-08-03 DIAGNOSIS — N289 Disorder of kidney and ureter, unspecified: Secondary | ICD-10-CM | POA: Diagnosis not present

## 2021-08-03 DIAGNOSIS — R410 Disorientation, unspecified: Secondary | ICD-10-CM | POA: Diagnosis not present

## 2021-08-03 DIAGNOSIS — E039 Hypothyroidism, unspecified: Secondary | ICD-10-CM | POA: Diagnosis not present

## 2021-08-03 DIAGNOSIS — E790 Hyperuricemia without signs of inflammatory arthritis and tophaceous disease: Secondary | ICD-10-CM | POA: Diagnosis not present

## 2021-08-03 DIAGNOSIS — M8580 Other specified disorders of bone density and structure, unspecified site: Secondary | ICD-10-CM | POA: Diagnosis not present

## 2021-08-03 DIAGNOSIS — E785 Hyperlipidemia, unspecified: Secondary | ICD-10-CM | POA: Diagnosis not present

## 2021-08-07 ENCOUNTER — Ambulatory Visit: Payer: PPO | Admitting: Neurology

## 2021-08-07 ENCOUNTER — Encounter: Payer: Self-pay | Admitting: Neurology

## 2021-08-07 VITALS — BP 139/67 | HR 81 | Resp 20 | Ht 64.0 in | Wt 143.0 lb

## 2021-08-07 DIAGNOSIS — R2681 Unsteadiness on feet: Secondary | ICD-10-CM

## 2021-08-07 DIAGNOSIS — R930 Abnormal findings on diagnostic imaging of skull and head, not elsewhere classified: Secondary | ICD-10-CM

## 2021-08-07 DIAGNOSIS — M5416 Radiculopathy, lumbar region: Secondary | ICD-10-CM

## 2021-08-07 DIAGNOSIS — R413 Other amnesia: Secondary | ICD-10-CM | POA: Diagnosis not present

## 2021-08-07 MED ORDER — GABAPENTIN 100 MG PO CAPS: ORAL_CAPSULE | ORAL | 3 refills | Status: DC

## 2021-08-07 NOTE — Patient Instructions (Addendum)
Start gabapentin '100mg'$  at bedtime x 1 week, then increase to 2 tablets at bedtime  MRI brain without contrast  MRI lumbar spine without contrast  Start physical therapy for low back strengthening and balance  Return to clinic 3 months  We have sent a referral to [facility] for your MRI and they will call you directly to schedule your appointment. They are located at [place]. If you need to contact them directly please call [phone].

## 2021-08-07 NOTE — Progress Notes (Signed)
[facility] Clinic Note - Initial Visit   Date: 08/07/21  Jean Scott MRN: [id number] DOB: 1950/05/14   Dear Dr. Dagmar Hait:  Thank you for your kind referral of Jean Scott for consultation of abnormal CT head. Although her history is well known to you, please allow [place] to reiterate it for the purpose of our medical record. The patient was accompanied to the clinic by husband, Chip, who also provides collateral information.     History of Present Illness: Jean Scott is a 71 y.o. right-handed female with hypothyroidism, hyperlipidemia, chronic fatigue syndrome presenting for evaluation of abnormal CT head, concerning for hydrocephalus  Starting around 2021, she began having unsteadiness which is worse with prolonged walking.  She has fallen about 4 times over the past year.  She walks unassisted.  Even at rest, she feels unsteady. She endorses low back pain and bilateral leg pain, described as needle pricks, which is worse in the morning.  She takes Advil and applies Biofreeze, which help a little.     She has lost control of urine and bowel about 3 times.   She has difficulty with memory and has noticed difficulty with mathematics, despite being a math major. Husband does grocery shopping, cleaning, and cooking.  She manages finances and drives.  This started in 1991 when she was diagnosed with chronic fatigue syndrome.   She had CT head performed on 07/31/2021 because of confusion and unsteadiness which shows findings concerning for normal pressure hydrocephalus and was referred for further evaluation.   History of seizures as a child previously treated with dilantin.  Off antiepileptic medication for > 40 years.   Out-side paper records, electronic medical record, and images have been reviewed where available and summarized as:  CT head 07/31/2021: Probable communicating hydrocephalus is noted concerning for possible normal pressure hydrocephalus.   Mild  chronic ischemic white matter disease is noted.    Past Medical History:  Diagnosis Date   Allergic rhinitis    Dust mites, mold, plant polens   Anemia 1970's   Arm fracture 1957   Depression 1991   6 months   Elevated uric acid in blood    Fibroids    Fibromyalgia 1990   Fibrosis, breast    Mammogram   Glucose intolerance (impaired glucose tolerance)    Grand mal seizure ([place]) 1960   Hemorrhoids, internal [id number]   History of hypercalcemia    Hyperlipidemia    Hypothyroidism 1990   Osteopenia 2012   Bone density Test   Seizures (HCC)    Urinary tract infection     Past Surgical History:  Procedure Laterality Date   ABDOMINAL HYSTERECTOMY  1989   open surgery for severe endometriosis but did initially have laproscopic   cataracts bilat     10/24/14 10/31/2014   CYSTOSCOPY     stent removal-[facility]-Jan 34,7425   Laparotomy  1987   URETEROSCOPY     Jan 10,2017     Medications:  Outpatient Encounter Medications as of 08/07/2021  Medication Sig Note   amphetamine-dextroamphetamine (ADDERALL) 20 MG tablet Take 20 mg in the morning and 10 mg at lunch    anti-nausea (EMETROL) solution Take 10 mLs by mouth 2 (two) times daily.    Cholecalciferol (VITAMIN D3) 2000 units TABS Take 2,000 Units by mouth daily.    clonazePAM (KLONOPIN) 1 MG tablet Take .75 mg at bedtime and .25 mg in the morning    conjugated estrogens (PREMARIN) vaginal cream Place 1  Applicatorful vaginally daily.    diphenhydrAMINE-APAP, sleep, 38-500 MG TABS Take by mouth at bedtime.  11/20/2013: Received from: [facility]   fluticasone (FLONASE) 50 MCG/ACT nasal spray Place 1 spray into both nostrils daily.    gabapentin (NEURONTIN) 100 MG capsule Start 1 tablet at bedtime x 1 week, then increase to 2 tablet at bedtime.    Ibuprofen 200 MG CAPS Take by mouth. 11/20/2013: Received from: [facility]   levothyroxine (SYNTHROID, LEVOTHROID) 88 MCG tablet Take 88 mcg by mouth daily before breakfast.     Melatonin 2.5 MG CAPS Take 2.5 mg by mouth. 11/20/2013: Received from: [facility]   mometasone (ELOCON) 0.1 % cream  11/20/2013: Received from: External Pharmacy   Omega-3 Fatty Acids (FISH OIL PO) Take 2 capsules by mouth daily.    omeprazole (PRILOSEC OTC) 20 MG tablet Take 20 mg by mouth as needed.    Probiotic Product (PROBIOTIC DAILY PO) Take 1 tablet by mouth daily.    Red Yeast Rice Extract (RED YEAST RICE PO) Take 1 capsule by mouth daily.    REPATHA SURECLICK 161 MG/ML SOAJ ADMINISTER 1 ML UNDER THE SKIN EVERY 14 DAYS    estradiol (ESTRACE) 0.5 MG tablet Take 1 tablet (0.5 mg total) by mouth daily.    No facility-administered encounter medications on file as of 08/07/2021.    Allergies:  Allergies  Allergen Reactions   Sulfamethoxazole-Trimethoprim Other (See Comments)    Anxiety, jittery   Alprazolam Other (See Comments)    Get too groggy can't function   Chlorthalidone     Constipation    Ciprofloxacin     Pt gets dehydrated   Epinephrine     Makes pt feel like they are coming out of their skin   Potassium Citrate     Constipation; "CSF crash so I wasn't functioning well at all"    Family History: Family History  Problem Relation Age of Onset   Parkinson's disease Father        Deceased at 70   Hypertension Mother    Osteoporosis Mother    Breast cancer Sister    Arthritis Brother    Kidney Stones Brother    Gout Brother    Pancreatic cancer Paternal Aunt    Colon cancer Neg Hx    Colon polyps Neg Hx     Social History: Social History   Tobacco Use   Smoking status: Never   Smokeless tobacco: Never  Vaping Use   Vaping Use: Never used  Substance Use Topics   Alcohol use: No   Drug use: No   Social History   Social History Narrative   Right handed   Drinks caffeine   Two story home    Vital Signs:  BP 139/67   Pulse 81   Resp 20   Ht '5\' 4"'$  (1.626 m)   Wt 143 lb (64.9 kg)   SpO2 99%   BMI 24.55 kg/m   Neurological Exam: MENTAL  STATUS including orientation to time, place, person, recent and remote memory, attention span and concentration, language, and fund of knowledge is normal.  Speech is not dysarthric.  CRANIAL NERVES: II:  No visual field defects.   III-IV-VI: Pupils equal round and reactive to light.  Normal conjugate, extra-ocular eye movements in all directions of gaze.  No nystagmus.  No ptosis.   V:  Normal facial sensation.    VII:  Normal facial symmetry and movements.   VIII:  Normal hearing and vestibular function.  IX-X:  Normal palatal movement.   XI:  Normal shoulder shrug and head rotation.   XII:  Normal tongue strength and range of motion, no deviation or fasciculation.  MOTOR:  No atrophy, fasciculations or abnormal movements.  No pronator drift.   Upper Extremity:  Right  Left  Deltoid  5/5   5/5   Biceps  5/5   5/5   Triceps  5/5   5/5   Finger extensors  5/5   5/5   Finger flexors  5/5   5/5   Dorsal interossei  5/5   5/5   Abductor pollicis  5/5   5/5   Tone (Ashworth scale)  0  0   Lower Extremity:  Right  Left  Hip flexors  5/5   5/5   Hip extensors  5/5   5/5   Adductor 5/5  5/5  Abductor 5/5  5/5  Knee flexors  5/5   5/5   Knee extensors  5/5   5/5   Dorsiflexors  5/5   5/5   Plantarflexors  5/5   5/5   Toe extensors  5/5   5/5   Toe flexors  5/5   5/5   Tone (Ashworth scale)  0  0   MSRs:  Right        Left                  brachioradialis 2+  2+  biceps 2+  2+  triceps 2+  2+  patellar 2+  2+  ankle jerk 2+  2+  Hoffman no  no  plantar response down  down   SENSORY:  Normal and symmetric perception of light touch, pinprick, vibration, and proprioception.  Romberg's sign absent.   COORDINATION/GAIT: Normal finger-to- nose-finger and heel-to-shin.  Intact rapid alternating movements bilaterally.  Gait appears mildly wide-based, unsteady, unassisted.    IMPRESSION: Possible hydrocephalus based on CT findings, recommend further imaging with MRI brain.  Exam  and history is not typical for NPH, where I would expect more subacute onset of symptoms.  Patient reports cognitive impairment has been present since 1991 and gait unsteadiness since 2021.   - If MRI brain is suggestive of NPH, the next step is large volume LP with physical therapy assessment.  Gait unsteadiness - Start PT for gait training - Fall precautions discussed Bilateral leg pain, possible lumbar radiculopathy - MRI lumbar spine wo contrast  - Start PT for low back strengthening   - Start gabapentin '100mg'$  at bedtime x 1 week, then titrate to '200mg'$  at bedtime  Return to clinic in 3 months.  Total time spent reviewing records, interview, history/exam, documentation, and coordination of care on day of encounter:  60 min   Thank you for allowing me to participate in patient's care.  If I can answer any additional questions, I would be pleased to do so.    Sincerely,    Malijah Lietz K. Posey Pronto, DO

## 2021-08-08 ENCOUNTER — Encounter: Payer: Self-pay | Admitting: Neurology

## 2021-08-24 ENCOUNTER — Ambulatory Visit
Admission: RE | Admit: 2021-08-24 | Discharge: 2021-08-24 | Disposition: A | Discharge status: Home / Self Care | Payer: PPO | Source: Ambulatory Visit | Attending: Neurology | Admitting: Neurology

## 2021-08-24 DIAGNOSIS — M4317 Spondylolisthesis, lumbosacral region: Secondary | ICD-10-CM | POA: Diagnosis not present

## 2021-08-24 DIAGNOSIS — M545 Low back pain, unspecified: Secondary | ICD-10-CM | POA: Diagnosis not present

## 2021-08-24 DIAGNOSIS — I6782 Cerebral ischemia: Secondary | ICD-10-CM | POA: Diagnosis not present

## 2021-08-24 DIAGNOSIS — M4316 Spondylolisthesis, lumbar region: Secondary | ICD-10-CM | POA: Diagnosis not present

## 2021-08-24 DIAGNOSIS — J320 Chronic maxillary sinusitis: Secondary | ICD-10-CM | POA: Diagnosis not present

## 2021-08-24 DIAGNOSIS — R413 Other amnesia: Secondary | ICD-10-CM | POA: Diagnosis not present

## 2021-08-24 DIAGNOSIS — M48061 Spinal stenosis, lumbar region without neurogenic claudication: Secondary | ICD-10-CM | POA: Diagnosis not present

## 2021-08-24 DIAGNOSIS — R2681 Unsteadiness on feet: Secondary | ICD-10-CM | POA: Diagnosis not present

## 2021-08-31 ENCOUNTER — Encounter: Payer: Self-pay | Admitting: Neurology

## 2021-09-02 DIAGNOSIS — N200 Calculus of kidney: Secondary | ICD-10-CM | POA: Diagnosis not present

## 2021-09-08 ENCOUNTER — Telehealth: Payer: Self-pay | Admitting: Neurology

## 2021-09-08 NOTE — Telephone Encounter (Signed)
Late entry.  Results of MRI brain and lumbar spine discussed with patient.  MRI brain findings are most suggestive of hydrocephalus ex vacuo moreso that NPH.  Patient denies urinary incontinence or memory changes. Unless symptoms change in a manner favoring NPH, will hold on LP for now.  MRI lumbar spine shows multilevel degenerative changes.  She is doing PT.  Will reassess at follow-up visit.   Nafis Farnan K. Posey Pronto, DO

## 2021-09-09 DIAGNOSIS — G8929 Other chronic pain: Secondary | ICD-10-CM | POA: Diagnosis not present

## 2021-09-09 DIAGNOSIS — M545 Low back pain, unspecified: Secondary | ICD-10-CM | POA: Diagnosis not present

## 2021-09-09 DIAGNOSIS — M6281 Muscle weakness (generalized): Secondary | ICD-10-CM | POA: Diagnosis not present

## 2021-09-10 DIAGNOSIS — N200 Calculus of kidney: Secondary | ICD-10-CM | POA: Diagnosis not present

## 2021-09-14 ENCOUNTER — Encounter: Payer: Self-pay | Admitting: Cardiovascular Disease

## 2021-09-21 DIAGNOSIS — M6281 Muscle weakness (generalized): Secondary | ICD-10-CM | POA: Diagnosis not present

## 2021-09-21 DIAGNOSIS — G8929 Other chronic pain: Secondary | ICD-10-CM | POA: Diagnosis not present

## 2021-09-21 DIAGNOSIS — M545 Low back pain, unspecified: Secondary | ICD-10-CM | POA: Diagnosis not present

## 2021-09-21 MED ORDER — GABAPENTIN 300 MG PO CAPS: 300.0000 mg | ORAL_CAPSULE | Freq: Every day | ORAL | 5 refills | Status: DC

## 2021-09-22 DIAGNOSIS — G9332 Myalgic encephalomyelitis/chronic fatigue syndrome: Secondary | ICD-10-CM | POA: Diagnosis not present

## 2021-09-22 DIAGNOSIS — Z01812 Encounter for preprocedural laboratory examination: Secondary | ICD-10-CM | POA: Diagnosis not present

## 2021-09-22 DIAGNOSIS — N200 Calculus of kidney: Secondary | ICD-10-CM | POA: Diagnosis not present

## 2021-09-22 DIAGNOSIS — K219 Gastro-esophageal reflux disease without esophagitis: Secondary | ICD-10-CM | POA: Diagnosis not present

## 2021-09-22 DIAGNOSIS — R011 Cardiac murmur, unspecified: Secondary | ICD-10-CM | POA: Diagnosis not present

## 2021-09-22 DIAGNOSIS — E785 Hyperlipidemia, unspecified: Secondary | ICD-10-CM | POA: Diagnosis not present

## 2021-09-22 DIAGNOSIS — I7121 Aneurysm of the ascending aorta, without rupture: Secondary | ICD-10-CM | POA: Diagnosis not present

## 2021-09-22 DIAGNOSIS — Z79899 Other long term (current) drug therapy: Secondary | ICD-10-CM | POA: Diagnosis not present

## 2021-09-22 DIAGNOSIS — I10 Essential (primary) hypertension: Secondary | ICD-10-CM | POA: Diagnosis not present

## 2021-09-28 DIAGNOSIS — N200 Calculus of kidney: Secondary | ICD-10-CM | POA: Diagnosis not present

## 2021-09-28 DIAGNOSIS — Z87440 Personal history of urinary (tract) infections: Secondary | ICD-10-CM | POA: Diagnosis not present

## 2021-10-05 ENCOUNTER — Ambulatory Visit: Payer: PPO | Admitting: Neurology

## 2021-10-12 DIAGNOSIS — R011 Cardiac murmur, unspecified: Secondary | ICD-10-CM | POA: Diagnosis not present

## 2021-10-12 DIAGNOSIS — Z79899 Other long term (current) drug therapy: Secondary | ICD-10-CM | POA: Diagnosis not present

## 2021-10-12 DIAGNOSIS — N132 Hydronephrosis with renal and ureteral calculous obstruction: Secondary | ICD-10-CM | POA: Diagnosis not present

## 2021-10-12 DIAGNOSIS — K219 Gastro-esophageal reflux disease without esophagitis: Secondary | ICD-10-CM | POA: Diagnosis not present

## 2021-10-12 DIAGNOSIS — E785 Hyperlipidemia, unspecified: Secondary | ICD-10-CM | POA: Diagnosis not present

## 2021-10-12 DIAGNOSIS — E039 Hypothyroidism, unspecified: Secondary | ICD-10-CM | POA: Diagnosis not present

## 2021-10-12 DIAGNOSIS — Z87442 Personal history of urinary calculi: Secondary | ICD-10-CM | POA: Diagnosis not present

## 2021-10-12 DIAGNOSIS — G9332 Myalgic encephalomyelitis/chronic fatigue syndrome: Secondary | ICD-10-CM | POA: Diagnosis not present

## 2021-10-12 DIAGNOSIS — I10 Essential (primary) hypertension: Secondary | ICD-10-CM | POA: Diagnosis not present

## 2021-10-12 DIAGNOSIS — N200 Calculus of kidney: Secondary | ICD-10-CM | POA: Diagnosis not present

## 2021-10-12 DIAGNOSIS — I7121 Aneurysm of the ascending aorta, without rupture: Secondary | ICD-10-CM | POA: Diagnosis not present

## 2021-10-12 DIAGNOSIS — Q631 Lobulated, fused and horseshoe kidney: Secondary | ICD-10-CM | POA: Diagnosis not present

## 2021-10-12 HISTORY — PX: OTHER SURGICAL HISTORY: SHX169

## 2021-10-20 DIAGNOSIS — R059 Cough, unspecified: Secondary | ICD-10-CM | POA: Diagnosis not present

## 2021-10-20 DIAGNOSIS — J028 Acute pharyngitis due to other specified organisms: Secondary | ICD-10-CM | POA: Diagnosis not present

## 2021-10-20 DIAGNOSIS — N200 Calculus of kidney: Secondary | ICD-10-CM | POA: Diagnosis not present

## 2021-10-20 DIAGNOSIS — Z1152 Encounter for screening for COVID-19: Secondary | ICD-10-CM | POA: Diagnosis not present

## 2021-10-20 DIAGNOSIS — R5383 Other fatigue: Secondary | ICD-10-CM | POA: Diagnosis not present

## 2021-10-20 DIAGNOSIS — R0981 Nasal congestion: Secondary | ICD-10-CM | POA: Diagnosis not present

## 2021-10-20 DIAGNOSIS — U071 COVID-19: Secondary | ICD-10-CM | POA: Diagnosis not present

## 2021-10-20 DIAGNOSIS — R5081 Fever presenting with conditions classified elsewhere: Secondary | ICD-10-CM | POA: Diagnosis not present

## 2021-11-03 DIAGNOSIS — R800 Isolated proteinuria: Secondary | ICD-10-CM | POA: Diagnosis not present

## 2021-11-03 DIAGNOSIS — N200 Calculus of kidney: Secondary | ICD-10-CM | POA: Diagnosis not present

## 2021-11-03 DIAGNOSIS — Z960 Presence of urogenital implants: Secondary | ICD-10-CM | POA: Diagnosis not present

## 2021-11-03 DIAGNOSIS — R310 Gross hematuria: Secondary | ICD-10-CM | POA: Diagnosis not present

## 2021-11-03 DIAGNOSIS — R82998 Other abnormal findings in urine: Secondary | ICD-10-CM | POA: Diagnosis not present

## 2021-11-05 NOTE — Progress Notes (Signed)
Follow-up Visit   Date: 11/09/2021    Jean Scott MRN: [id number] DOB: 11-25-1950    Jean Scott is a 71 y.o. right-handed Caucasian female with hypothyroidism, hyperlipidemia, chronic fatigue syndrome  returning to the clinic for follow-up of unsteady gait.  The patient was accompanied to the clinic by husband who also provides collateral information.    IMPRESSION/PLAN: Hydrocephalus ex vacuo based on MRI findings and clinical history.  MRI brain personally viewed and does not favor NPH.  Patient denies urinary incontinence or memory changes. Unless symptoms change in a manner favoring NPH, will hold on LP for now.  Multilevel lumbosacral degenerative spondylosis and radiculopathy.  Stable.  Patient to restart PT fow low back strengthening when some of her there medical conditions are improved. For pain, start Cymbalta '30mg'$  daily.   Return to clinic in 4 months  --------------------------------------------- History of present illness: Starting around 2021, she began having unsteadiness which is worse with prolonged walking.  She has fallen about 4 times over the past year.  She walks unassisted.  Even at rest, she feels unsteady. She endorses low back pain and bilateral leg pain, described as needle pricks, which is worse in the morning.  She takes Advil and applies Biofreeze, which help a little.      She has lost control of urine and bowel about 3 times.    She has difficulty with memory and has noticed difficulty with mathematics, despite being a math major. Husband does grocery shopping, cleaning, and cooking.  She manages finances and drives.  This started in 1991 when she was diagnosed with chronic fatigue syndrome.    She had CT head performed on 07/31/2021 because of confusion and unsteadiness which shows findings concerning for normal pressure hydrocephalus and was referred for further evaluation.    History of seizures as a child previously treated with dilantin.   Off antiepileptic medication for > 40 years.   UPDATE 11/09/2021:  Since October, she has been dealing with a number of medical conditions, including kidney stone, UTI, and C.difficile.  She continues to have bilateral leg pain and takes Advil daily.  She did not tolerate gabapentin due to cognitive side effects, so stopped it.  No new weakness or falls.  She had to stop PT due to other medical conditions.  Her urinary incontinence has improved since having kidney stone addressed.  No new changes with memory.  No interval falls.    Medications:  Current Outpatient Medications on File Prior to Visit  Medication Sig Dispense Refill   amphetamine-dextroamphetamine (ADDERALL) 20 MG tablet Take 20 mg in the morning and 10 mg at lunch     anti-nausea (EMETROL) solution Take 10 mLs by mouth 2 (two) times daily. 118 mL 0   Cholecalciferol (VITAMIN D3) 2000 units TABS Take 2,000 Units by mouth daily. 30 tablet    clonazePAM (KLONOPIN) 1 MG tablet Take .75 mg at bedtime and .25 mg in the morning 30 tablet    conjugated estrogens (PREMARIN) vaginal cream Place 1 Applicatorful vaginally 3 (three) times a week.     diphenhydrAMINE-APAP, sleep, 38-500 MG TABS Take by mouth at bedtime.      fluticasone (FLONASE) 50 MCG/ACT nasal spray Place 1 spray into both nostrils daily.  2   Ibuprofen 200 MG CAPS Take by mouth.     levothyroxine (SYNTHROID, LEVOTHROID) 88 MCG tablet Take 88 mcg by mouth daily before breakfast.     Melatonin 2.5 MG CAPS Take 2.5 mg  by mouth.     mometasone (ELOCON) 0.1 % cream   2   Omega-3 Fatty Acids (FISH OIL PO) Take 2 capsules by mouth daily.     omeprazole (PRILOSEC OTC) 20 MG tablet Take 20 mg by mouth as needed.     Probiotic Product (PROBIOTIC DAILY PO) Take 1 tablet by mouth daily.     Red Yeast Rice Extract (RED YEAST RICE PO) Take 1 capsule by mouth daily.     REPATHA SURECLICK 671 MG/ML SOAJ ADMINISTER 1 ML UNDER THE SKIN EVERY 14 DAYS 6 mL 1   sulfamethoxazole-trimethoprim  (BACTRIM DS) 800-160 MG tablet Take 1 tablet by mouth 2 (two) times daily.     No current facility-administered medications on file prior to visit.    Allergies:  Allergies  Allergen Reactions   Sulfamethoxazole-Trimethoprim Other (See Comments)    Anxiety, jittery   Alprazolam Other (See Comments)    Get too groggy can't function   Chlorthalidone     Constipation    Ciprofloxacin     Pt gets dehydrated   Epinephrine     Makes pt feel like they are coming out of their skin   Potassium Citrate     Constipation; "CSF crash so I wasn't functioning well at all"    Vital Signs:  BP 136/62   Pulse 88   Ht '5\' 4"'$  (1.626 m)   Wt 145 lb 12.8 oz (66.1 kg)   SpO2 98%   BMI 25.03 kg/m    Neurological Exam: MENTAL STATUS including orientation to time, place, person, recent and remote memory, attention span and concentration, language, and fund of knowledge is fairly intact.  Speech is not dysarthric.  CRANIAL NERVES:  Pupils equal round and reactive to light.  Normal conjugate, extra-ocular eye movements in all directions of gaze.  No ptosis.  Face is symmetric.   MOTOR:  Motor strength is 5/5 in all extremities.  No atrophy, fasciculations or abnormal movements.  No pronator drift.  Tone is normal.    MSRs:  Reflexes are 2+/4 throughout.  SENSORY:  Intact to vibration throughout.  COORDINATION/GAIT:  Normal finger-to- nose-finger.  Intact rapid alternating movements bilaterally.  Gait is mildly wide-based, unsteady when unassisted.    Data: MRI brain wo contrast 08/26/2021: Chronic small-vessel ischemic changes affecting the pons and affecting the cerebral hemispheric white matter to a severe degree. Enlargement of the ventricular system, favored to be due to central atrophy and ex vacuo enlargement. However, normal pressure hydrocephalus is possible based on the imaging.   Maxillary sinusitis with mucosal thickening and layering fluid.  MRI lumbar spine 08/26/2021: L3-4:  Asymmetric disc bulge more prominent towards the right with right foraminal encroachment that could possibly affect the right L3 nerve.   L4-5: Facet degeneration and hypertrophy with 2 mm of anterolisthesis. Bulging of the disc. Mild multifactorial stenosis with crowding of the nerve roots but no visible neural compression.   L5-S1: Advanced bilateral facet arthropathy with 4 mm of anterolisthesis, which could worsen with standing or flexion. Endplate osteophytes and bulging of the disc. Multifactorial spinal stenosis of the canal, lateral recesses and neural foramina that could cause neural compression on either or both sides. This appearance would likely worsen with standing or flexion. Discogenic endplate marrow changes of the endplates are likely associated with regional back pain.    Thank you for allowing me to participate in patient's care.  If I can answer any additional questions, I would be pleased to do so.  Sincerely,    Zariel Capano K. Posey Pronto, DO

## 2021-11-09 ENCOUNTER — Ambulatory Visit: Payer: PPO | Admitting: Neurology

## 2021-11-09 ENCOUNTER — Encounter: Payer: Self-pay | Admitting: Neurology

## 2021-11-09 VITALS — BP 136/62 | HR 88 | Ht 64.0 in | Wt 145.8 lb

## 2021-11-09 DIAGNOSIS — M5416 Radiculopathy, lumbar region: Secondary | ICD-10-CM | POA: Diagnosis not present

## 2021-11-09 DIAGNOSIS — R2681 Unsteadiness on feet: Secondary | ICD-10-CM

## 2021-11-09 MED ORDER — DULOXETINE HCL 30 MG PO CPEP: 30.0000 mg | ORAL_CAPSULE | Freq: Every day | ORAL | 3 refills | Status: DC

## 2021-11-09 NOTE — Patient Instructions (Signed)
Start Cymbalta '30mg'$  at bedtime  When you are feeling better, restart physical therapy   Return to clinic in 4 months

## 2021-11-17 DIAGNOSIS — N3020 Other chronic cystitis without hematuria: Secondary | ICD-10-CM | POA: Diagnosis not present

## 2021-11-17 DIAGNOSIS — N200 Calculus of kidney: Secondary | ICD-10-CM | POA: Diagnosis not present

## 2021-11-17 DIAGNOSIS — N952 Postmenopausal atrophic vaginitis: Secondary | ICD-10-CM | POA: Diagnosis not present

## 2021-11-17 DIAGNOSIS — Q631 Lobulated, fused and horseshoe kidney: Secondary | ICD-10-CM | POA: Diagnosis not present

## 2021-11-18 ENCOUNTER — Encounter: Payer: Self-pay | Admitting: Neurology

## 2021-11-18 DIAGNOSIS — I519 Heart disease, unspecified: Secondary | ICD-10-CM | POA: Diagnosis not present

## 2021-11-18 DIAGNOSIS — R7301 Impaired fasting glucose: Secondary | ICD-10-CM | POA: Diagnosis not present

## 2021-11-18 DIAGNOSIS — E039 Hypothyroidism, unspecified: Secondary | ICD-10-CM | POA: Diagnosis not present

## 2021-11-18 DIAGNOSIS — M8580 Other specified disorders of bone density and structure, unspecified site: Secondary | ICD-10-CM | POA: Diagnosis not present

## 2021-11-18 DIAGNOSIS — E785 Hyperlipidemia, unspecified: Secondary | ICD-10-CM | POA: Diagnosis not present

## 2021-11-18 DIAGNOSIS — I10 Essential (primary) hypertension: Secondary | ICD-10-CM | POA: Diagnosis not present

## 2021-11-18 DIAGNOSIS — N3020 Other chronic cystitis without hematuria: Secondary | ICD-10-CM | POA: Diagnosis not present

## 2021-11-24 DIAGNOSIS — Z1212 Encounter for screening for malignant neoplasm of rectum: Secondary | ICD-10-CM | POA: Diagnosis not present

## 2021-11-25 DIAGNOSIS — R410 Disorientation, unspecified: Secondary | ICD-10-CM | POA: Diagnosis not present

## 2021-11-25 DIAGNOSIS — N200 Calculus of kidney: Secondary | ICD-10-CM | POA: Diagnosis not present

## 2021-11-25 DIAGNOSIS — K5900 Constipation, unspecified: Secondary | ICD-10-CM | POA: Diagnosis not present

## 2021-11-25 DIAGNOSIS — I519 Heart disease, unspecified: Secondary | ICD-10-CM | POA: Diagnosis not present

## 2021-11-25 DIAGNOSIS — E039 Hypothyroidism, unspecified: Secondary | ICD-10-CM | POA: Diagnosis not present

## 2021-11-25 DIAGNOSIS — R7301 Impaired fasting glucose: Secondary | ICD-10-CM | POA: Diagnosis not present

## 2021-11-25 DIAGNOSIS — I712 Thoracic aortic aneurysm, without rupture, unspecified: Secondary | ICD-10-CM | POA: Diagnosis not present

## 2021-11-25 DIAGNOSIS — Z0000 Encounter for general adult medical examination without abnormal findings: Secondary | ICD-10-CM | POA: Diagnosis not present

## 2021-11-25 DIAGNOSIS — I10 Essential (primary) hypertension: Secondary | ICD-10-CM | POA: Diagnosis not present

## 2021-11-25 DIAGNOSIS — E790 Hyperuricemia without signs of inflammatory arthritis and tophaceous disease: Secondary | ICD-10-CM | POA: Diagnosis not present

## 2021-11-25 DIAGNOSIS — G629 Polyneuropathy, unspecified: Secondary | ICD-10-CM | POA: Diagnosis not present

## 2021-11-25 DIAGNOSIS — N289 Disorder of kidney and ureter, unspecified: Secondary | ICD-10-CM | POA: Diagnosis not present

## 2021-11-29 DIAGNOSIS — N200 Calculus of kidney: Secondary | ICD-10-CM | POA: Diagnosis not present

## 2021-11-30 DIAGNOSIS — N200 Calculus of kidney: Secondary | ICD-10-CM | POA: Diagnosis not present

## 2021-12-16 ENCOUNTER — Encounter: Payer: Self-pay | Admitting: Neurology

## 2021-12-18 ENCOUNTER — Other Ambulatory Visit: Payer: Self-pay

## 2021-12-18 DIAGNOSIS — M5416 Radiculopathy, lumbar region: Secondary | ICD-10-CM

## 2021-12-18 DIAGNOSIS — R2681 Unsteadiness on feet: Secondary | ICD-10-CM

## 2021-12-25 ENCOUNTER — Encounter: Payer: Self-pay | Admitting: Neurology

## 2021-12-30 ENCOUNTER — Telehealth: Payer: Self-pay

## 2021-12-30 NOTE — Telephone Encounter (Signed)
Pt called and requested to speak with pharmacist concerning Repatha. Stated she had an urgent question concerning her insurance and current grant.

## 2022-01-12 DIAGNOSIS — M79605 Pain in left leg: Secondary | ICD-10-CM | POA: Diagnosis not present

## 2022-01-12 DIAGNOSIS — R262 Difficulty in walking, not elsewhere classified: Secondary | ICD-10-CM | POA: Diagnosis not present

## 2022-01-12 DIAGNOSIS — M6281 Muscle weakness (generalized): Secondary | ICD-10-CM | POA: Diagnosis not present

## 2022-01-12 DIAGNOSIS — M5442 Lumbago with sciatica, left side: Secondary | ICD-10-CM | POA: Diagnosis not present

## 2022-01-12 DIAGNOSIS — R296 Repeated falls: Secondary | ICD-10-CM | POA: Diagnosis not present

## 2022-01-12 DIAGNOSIS — M545 Low back pain, unspecified: Secondary | ICD-10-CM | POA: Diagnosis not present

## 2022-01-15 NOTE — Telephone Encounter (Signed)
Applied for [employer]  (Key: [id number]) Waiting for Determination

## 2022-01-16 DIAGNOSIS — N200 Calculus of kidney: Secondary | ICD-10-CM | POA: Diagnosis not present

## 2022-01-20 DIAGNOSIS — M545 Low back pain, unspecified: Secondary | ICD-10-CM | POA: Diagnosis not present

## 2022-01-20 DIAGNOSIS — M5442 Lumbago with sciatica, left side: Secondary | ICD-10-CM | POA: Diagnosis not present

## 2022-01-20 DIAGNOSIS — R262 Difficulty in walking, not elsewhere classified: Secondary | ICD-10-CM | POA: Diagnosis not present

## 2022-01-20 DIAGNOSIS — M79605 Pain in left leg: Secondary | ICD-10-CM | POA: Diagnosis not present

## 2022-01-20 DIAGNOSIS — R296 Repeated falls: Secondary | ICD-10-CM | POA: Diagnosis not present

## 2022-01-20 DIAGNOSIS — M6281 Muscle weakness (generalized): Secondary | ICD-10-CM | POA: Diagnosis not present

## 2022-01-21 DIAGNOSIS — H5212 Myopia, left eye: Secondary | ICD-10-CM | POA: Diagnosis not present

## 2022-01-25 DIAGNOSIS — M79605 Pain in left leg: Secondary | ICD-10-CM | POA: Diagnosis not present

## 2022-01-25 DIAGNOSIS — M545 Low back pain, unspecified: Secondary | ICD-10-CM | POA: Diagnosis not present

## 2022-01-25 DIAGNOSIS — M5442 Lumbago with sciatica, left side: Secondary | ICD-10-CM | POA: Diagnosis not present

## 2022-01-25 DIAGNOSIS — R262 Difficulty in walking, not elsewhere classified: Secondary | ICD-10-CM | POA: Diagnosis not present

## 2022-01-25 DIAGNOSIS — N3020 Other chronic cystitis without hematuria: Secondary | ICD-10-CM | POA: Diagnosis not present

## 2022-01-25 DIAGNOSIS — R296 Repeated falls: Secondary | ICD-10-CM | POA: Diagnosis not present

## 2022-01-25 DIAGNOSIS — M6281 Muscle weakness (generalized): Secondary | ICD-10-CM | POA: Diagnosis not present

## 2022-01-27 DIAGNOSIS — M5442 Lumbago with sciatica, left side: Secondary | ICD-10-CM | POA: Diagnosis not present

## 2022-01-27 DIAGNOSIS — R262 Difficulty in walking, not elsewhere classified: Secondary | ICD-10-CM | POA: Diagnosis not present

## 2022-01-27 DIAGNOSIS — R296 Repeated falls: Secondary | ICD-10-CM | POA: Diagnosis not present

## 2022-01-27 DIAGNOSIS — M79605 Pain in left leg: Secondary | ICD-10-CM | POA: Diagnosis not present

## 2022-01-27 DIAGNOSIS — M545 Low back pain, unspecified: Secondary | ICD-10-CM | POA: Diagnosis not present

## 2022-01-27 DIAGNOSIS — M6281 Muscle weakness (generalized): Secondary | ICD-10-CM | POA: Diagnosis not present

## 2022-02-08 DIAGNOSIS — R262 Difficulty in walking, not elsewhere classified: Secondary | ICD-10-CM | POA: Diagnosis not present

## 2022-02-08 DIAGNOSIS — M545 Low back pain, unspecified: Secondary | ICD-10-CM | POA: Diagnosis not present

## 2022-02-08 DIAGNOSIS — M5442 Lumbago with sciatica, left side: Secondary | ICD-10-CM | POA: Diagnosis not present

## 2022-02-08 DIAGNOSIS — R296 Repeated falls: Secondary | ICD-10-CM | POA: Diagnosis not present

## 2022-02-08 DIAGNOSIS — M79605 Pain in left leg: Secondary | ICD-10-CM | POA: Diagnosis not present

## 2022-02-08 DIAGNOSIS — M6281 Muscle weakness (generalized): Secondary | ICD-10-CM | POA: Diagnosis not present

## 2022-02-16 DIAGNOSIS — N200 Calculus of kidney: Secondary | ICD-10-CM | POA: Diagnosis not present

## 2022-02-16 DIAGNOSIS — Q631 Lobulated, fused and horseshoe kidney: Secondary | ICD-10-CM | POA: Diagnosis not present

## 2022-02-16 DIAGNOSIS — N201 Calculus of ureter: Secondary | ICD-10-CM | POA: Diagnosis not present

## 2022-02-16 DIAGNOSIS — Z87448 Personal history of other diseases of urinary system: Secondary | ICD-10-CM | POA: Diagnosis not present

## 2022-02-26 DIAGNOSIS — N3020 Other chronic cystitis without hematuria: Secondary | ICD-10-CM | POA: Diagnosis not present

## 2022-02-26 DIAGNOSIS — N202 Calculus of kidney with calculus of ureter: Secondary | ICD-10-CM | POA: Diagnosis not present

## 2022-03-03 ENCOUNTER — Encounter: Payer: Self-pay | Admitting: Neurology

## 2022-03-04 ENCOUNTER — Telehealth: Payer: Self-pay

## 2022-03-15 ENCOUNTER — Encounter: Payer: Self-pay | Admitting: Neurology

## 2022-03-15 ENCOUNTER — Ambulatory Visit: Payer: Medicare Other | Admitting: Neurology

## 2022-03-15 VITALS — BP 140/72 | HR 76 | Ht 64.0 in

## 2022-03-15 DIAGNOSIS — R930 Abnormal findings on diagnostic imaging of skull and head, not elsewhere classified: Secondary | ICD-10-CM

## 2022-03-15 DIAGNOSIS — R2681 Unsteadiness on feet: Secondary | ICD-10-CM

## 2022-03-15 DIAGNOSIS — M2550 Pain in unspecified joint: Secondary | ICD-10-CM | POA: Diagnosis not present

## 2022-03-15 NOTE — Patient Instructions (Signed)
Discontinue Cymbalta  Discuss with Dr. Dagmar Hait about medications you can use instead of aspirin for your arthritis  Return to clinic in 4 months

## 2022-03-15 NOTE — Progress Notes (Signed)
Follow-up Visit   Date: 03/15/2022    Jean Scott MRN: [id number] DOB: 24-Jan-1950    Jean Scott is a 72 y.o. right-handed Caucasian female with hypothyroidism, hyperlipidemia, chronic fatigue syndrome  returning to the clinic for follow-up of unsteady gait.  The patient was accompanied to the clinic by husband who also provides collateral information.    IMPRESSION/PLAN: Ventriculomegaly favored to be hydrocephalus ex vacuo based on MRI findings and clinical history.  Today, she reports intermittent urinary urgency and memory changes.  I offered lumbar puncture to further evaluation for NPH, however, she would like to monitor symptoms and reassess at her next visit.  2.  Multilevel lumbosacral degenerative spondylosis, asymptomatic.  She denies neuropathic pain and denies any benefit with Cymbalta, so will discontinue it.  3.  Polyarthalgia seems to be her primary complaint today.  She is treating this with aspirin '325mg'$  twice daily.  I advised her to follow-up with PCP to discuss management options.   Return to clinic in 4 months  --------------------------------------------- History of present illness: Starting around 2021, she began having unsteadiness which is worse with prolonged walking.  She has fallen about 4 times over the past year.  She walks unassisted.  Even at rest, she feels unsteady. She endorses low back pain and bilateral leg pain, described as needle pricks, which is worse in the morning.  She takes Advil and applies Biofreeze, which help a little.      She has lost control of urine and bowel about 3 times.    She has difficulty with memory and has noticed difficulty with mathematics, despite being a math major. Husband does grocery shopping, cleaning, and cooking.  She manages finances and drives.  This started in 1991 when she was diagnosed with chronic fatigue syndrome.    She had CT head performed on 07/31/2021 because of confusion and unsteadiness  which shows findings concerning for normal pressure hydrocephalus and was referred for further evaluation.    History of seizures as a child previously treated with dilantin.  Off antiepileptic medication for > 40 years.   UPDATE 11/09/2021:  Since October, she has been dealing with a number of medical conditions, including kidney stone, UTI, and C.difficile.  She continues to have bilateral leg pain and takes Advil daily.  She did not tolerate gabapentin due to cognitive side effects, so stopped it.  No new weakness or falls.  She had to stop PT due to other medical conditions.  Her urinary incontinence has improved since having kidney stone addressed.  No new changes with memory.  No interval falls.   UPDATE 03/15/2022:  She reports having no significant change in pain with Cymbalta '30mg'$ .  She denies tingling, burning, or stabbing pain.  She complain of stiffness and achy pain in the hips, legs, and back.  She takes aspirin '325mg'$  twice daily, which alleviates her joint pain.  She also reports mild memory changes with repeating the same questions.  Last month, she has a few spells of urinary urgency, where she was unable to get to the bathroom in time, but denies incontinence.    Medications:  Current Outpatient Medications on File Prior to Visit  Medication Sig Dispense Refill   amphetamine-dextroamphetamine (ADDERALL) 20 MG tablet Take 20 mg in the morning and 10 mg at lunch     anti-nausea (EMETROL) solution Take 10 mLs by mouth 2 (two) times daily. 118 mL 0   Cholecalciferol (VITAMIN D3) 2000 units TABS Take 2,000 Units  by mouth daily. 30 tablet    clonazePAM (KLONOPIN) 1 MG tablet Take .75 mg at bedtime and .25 mg in the morning 30 tablet    conjugated estrogens (PREMARIN) vaginal cream Place 1 Applicatorful vaginally 3 (three) times a week.     DULoxetine (CYMBALTA) 30 MG capsule Take 1 capsule (30 mg total) by mouth daily. 30 capsule 3   fluticasone (FLONASE) 50 MCG/ACT nasal spray Place 1  spray into both nostrils daily.  2   levothyroxine (SYNTHROID, LEVOTHROID) 88 MCG tablet Take 0.1 mcg by mouth daily before breakfast.     Melatonin 2.5 MG CAPS Take 2.5 mg by mouth.     Omega-3 Fatty Acids (FISH OIL PO) Take 2 capsules by mouth daily.     omeprazole (PRILOSEC OTC) 20 MG tablet Take 20 mg by mouth as needed.     Probiotic Product (PROBIOTIC DAILY PO) Take 1 tablet by mouth daily.     REPATHA SURECLICK [id number] MG/ML SOAJ ADMINISTER 1 ML UNDER THE SKIN EVERY 14 DAYS 6 mL 1   diphenhydrAMINE-APAP, sleep, 38-500 MG TABS Take by mouth at bedtime.  (Patient not taking: Reported on 03/15/2022)     Ibuprofen 200 MG CAPS Take by mouth. (Patient not taking: Reported on 03/15/2022)     mometasone (ELOCON) 0.1 % cream  (Patient not taking: Reported on 03/15/2022)  2   Red Yeast Rice Extract (RED YEAST RICE PO) Take 1 capsule by mouth daily. (Patient not taking: Reported on 03/15/2022)     No current facility-administered medications on file prior to visit.    Allergies:  Allergies  Allergen Reactions   Sulfamethoxazole-Trimethoprim Other (See Comments)    Anxiety, jittery   Alprazolam Other (See Comments)    Get too groggy can't function   Chlorthalidone     Constipation    Ciprofloxacin     Pt gets dehydrated   Epinephrine     Makes pt feel like they are coming out of their skin   Potassium Citrate     Constipation; "CSF crash so I wasn't functioning well at all"    Vital Signs:  Ht '5\' 4"'$  (1.626 m)   BMI 25.03 kg/m    Neurological Exam: MENTAL STATUS including orientation to time, place, person, recent and remote memory, attention span and concentration, language, and fund of knowledge is fairly intact.  Speech is not dysarthric.  CRANIAL NERVES:  Pupils equal round and reactive to light.  Normal conjugate, extra-ocular eye movements in all directions of gaze.  No ptosis.  Face is symmetric.   MOTOR:  Motor strength is 5/5 in all extremities.  No atrophy, fasciculations or  abnormal movements.  No pronator drift.  Tone is normal.    MSRs:  Reflexes are 2+/4 throughout.  SENSORY:  Intact to vibration throughout.  COORDINATION/GAIT:  Normal finger-to- nose-finger.  Intact rapid alternating movements bilaterally.  Gait is mildly wide-based, unsteady when unassisted.    Data: MRI brain wo contrast 08/26/2021: Chronic small-vessel ischemic changes affecting the pons and affecting the cerebral hemispheric white matter to a severe degree. Enlargement of the ventricular system, favored to be due to central atrophy and ex vacuo enlargement. However, normal pressure hydrocephalus is possible based on the imaging.   Maxillary sinusitis with mucosal thickening and layering fluid.  MRI lumbar spine 08/26/2021: L3-4: Asymmetric disc bulge more prominent towards the right with right foraminal encroachment that could possibly affect the right L3 nerve.   L4-5: Facet degeneration and hypertrophy with 2 mm of  anterolisthesis. Bulging of the disc. Mild multifactorial stenosis with crowding of the nerve roots but no visible neural compression.   L5-S1: Advanced bilateral facet arthropathy with 4 mm of anterolisthesis, which could worsen with standing or flexion. Endplate osteophytes and bulging of the disc. Multifactorial spinal stenosis of the canal, lateral recesses and neural foramina that could cause neural compression on either or both sides. This appearance would likely worsen with standing or flexion. Discogenic endplate marrow changes of the endplates are likely associated with regional back pain.   Total time spent reviewing records, interview, history/exam, documentation, and coordination of care on day of encounter:  30 min   Thank you for allowing me to participate in patient's care.  If I can answer any additional questions, I would be pleased to do so.    Sincerely,    Asuzena Weis K. Posey Pronto, DO

## 2022-03-29 ENCOUNTER — Encounter: Payer: Self-pay | Admitting: Neurology

## 2022-03-30 ENCOUNTER — Encounter: Payer: Self-pay | Admitting: Cardiovascular Disease

## 2022-03-31 ENCOUNTER — Other Ambulatory Visit: Payer: Self-pay

## 2022-03-31 DIAGNOSIS — G912 (Idiopathic) normal pressure hydrocephalus: Secondary | ICD-10-CM

## 2022-03-31 DIAGNOSIS — G919 Hydrocephalus, unspecified: Secondary | ICD-10-CM

## 2022-03-31 DIAGNOSIS — K080 Exfoliation of teeth due to systemic causes: Secondary | ICD-10-CM | POA: Diagnosis not present

## 2022-04-01 ENCOUNTER — Telehealth: Payer: Self-pay

## 2022-04-01 NOTE — Telephone Encounter (Signed)
Called [employer] prior authorization. Lumbar Puncture CPT 62270 does not require a prior authorization. Call Reference#:[id number].

## 2022-04-06 NOTE — Telephone Encounter (Signed)
Called central scheduling and patient is scheduled for Lumbar puncture on 04/19/2022 with arrival time of 8:30 am in entrance C. Lumbar puncture begins at 9:00 am.   Called Physical Therapy [phone]. Left a detailed message with patient information and appointment information and services needed for High Volume Lumbar puncture with physical therapy before and after. I have requested a call back.

## 2022-04-07 NOTE — Telephone Encounter (Signed)
Returned Hannah's call from Acute physical therapy  and left a message for a return call back.

## 2022-04-09 NOTE — Telephone Encounter (Signed)
Spoke to [place] with acute PT and provided patient information and stated we need PT before and after LP. Dahlia Client confirmed date and time is ok and she will coordinate with radiology to have patient arrive at 12:00 pm instead of 12:30 pm.

## 2022-04-19 ENCOUNTER — Other Ambulatory Visit (HOSPITAL_COMMUNITY): Payer: PPO

## 2022-04-19 DIAGNOSIS — N200 Calculus of kidney: Secondary | ICD-10-CM | POA: Diagnosis not present

## 2022-04-19 DIAGNOSIS — Q631 Lobulated, fused and horseshoe kidney: Secondary | ICD-10-CM | POA: Diagnosis not present

## 2022-04-19 DIAGNOSIS — R82991 Hypocitraturia: Secondary | ICD-10-CM | POA: Diagnosis not present

## 2022-04-21 ENCOUNTER — Other Ambulatory Visit: Payer: Self-pay | Admitting: Radiology

## 2022-04-22 ENCOUNTER — Other Ambulatory Visit: Payer: Self-pay

## 2022-04-22 ENCOUNTER — Ambulatory Visit (HOSPITAL_COMMUNITY): Payer: Medicare Other

## 2022-04-22 ENCOUNTER — Telehealth: Payer: Self-pay | Admitting: Neurology

## 2022-04-22 ENCOUNTER — Ambulatory Visit (HOSPITAL_COMMUNITY)
Admission: RE | Admit: 2022-04-22 | Discharge: 2022-04-22 | Disposition: A | Discharge status: Home / Self Care | Payer: Medicare Other | Source: Ambulatory Visit | Attending: Neurology | Admitting: Neurology

## 2022-04-22 DIAGNOSIS — R2681 Unsteadiness on feet: Secondary | ICD-10-CM | POA: Diagnosis not present

## 2022-04-22 DIAGNOSIS — Z0389 Encounter for observation for other suspected diseases and conditions ruled out: Secondary | ICD-10-CM | POA: Diagnosis not present

## 2022-04-22 DIAGNOSIS — G919 Hydrocephalus, unspecified: Secondary | ICD-10-CM | POA: Diagnosis not present

## 2022-04-22 MED ORDER — LIDOCAINE HCL (PF) 1 % IJ SOLN
5.0000 mL | Freq: Once | INTRAMUSCULAR | Status: AC
  Administered 2022-04-22: 8 mL via INTRADERMAL

## 2022-04-22 NOTE — Telephone Encounter (Signed)
Irving Burton with [facility] radiology called in stating the pt was there doing a lumbar puncture. She stated there was not any orders for the fluid to have labs done on it and she just wanted to make sure we didn't want any lab tests run on the fluid?

## 2022-04-22 NOTE — Telephone Encounter (Signed)
Called and read the comments to her and she just wanted to make sure there was no orders.

## 2022-04-27 ENCOUNTER — Encounter: Payer: Self-pay | Admitting: Neurology

## 2022-04-28 NOTE — Telephone Encounter (Signed)
Called patient to discuss results of LP, but there was no answer.  Message was left to return my call.  Large volume LP did show improvement in gait, consistent with normal pressure hydrocephalus (NPH).  (These results do not get uploaded in Epic). Management of NPH involving placing a shunt to divert the flow of spinal fluid, which is performed by neurosurgery.  I recommend referral to neurosurgery for their opinion.   Regarding Jean Scott hip and back pain, we can offer a muscle relaxant (tizanidine  at bedtime as needed).  She had mentioned having a lot of joint stiffness and pain at Jean Scott last visit and I recommend that she try to limit aspirin and talk to Jean Scott PCP about management options for joint pain.

## 2022-04-29 ENCOUNTER — Encounter: Payer: Self-pay | Admitting: Neurology

## 2022-05-03 ENCOUNTER — Other Ambulatory Visit: Payer: Self-pay

## 2022-05-03 DIAGNOSIS — G912 (Idiopathic) normal pressure hydrocephalus: Secondary | ICD-10-CM

## 2022-05-03 DIAGNOSIS — G919 Hydrocephalus, unspecified: Secondary | ICD-10-CM

## 2022-05-05 ENCOUNTER — Encounter: Payer: Self-pay | Admitting: Neurology

## 2022-05-10 DIAGNOSIS — G912 (Idiopathic) normal pressure hydrocephalus: Secondary | ICD-10-CM | POA: Diagnosis not present

## 2022-05-11 ENCOUNTER — Encounter: Payer: Self-pay | Admitting: Neurology

## 2022-05-24 ENCOUNTER — Other Ambulatory Visit: Payer: Self-pay | Admitting: Cardiovascular Disease

## 2022-05-24 DIAGNOSIS — I712 Thoracic aortic aneurysm, without rupture, unspecified: Secondary | ICD-10-CM

## 2022-05-24 DIAGNOSIS — E782 Mixed hyperlipidemia: Secondary | ICD-10-CM

## 2022-05-24 NOTE — Telephone Encounter (Signed)
Overdue for OV 

## 2022-05-25 DIAGNOSIS — I119 Hypertensive heart disease without heart failure: Secondary | ICD-10-CM | POA: Diagnosis not present

## 2022-05-25 DIAGNOSIS — I712 Thoracic aortic aneurysm, without rupture, unspecified: Secondary | ICD-10-CM | POA: Diagnosis not present

## 2022-05-25 DIAGNOSIS — R7301 Impaired fasting glucose: Secondary | ICD-10-CM | POA: Diagnosis not present

## 2022-06-02 DIAGNOSIS — N202 Calculus of kidney with calculus of ureter: Secondary | ICD-10-CM | POA: Diagnosis not present

## 2022-06-02 DIAGNOSIS — N3941 Urge incontinence: Secondary | ICD-10-CM | POA: Diagnosis not present

## 2022-06-02 DIAGNOSIS — N3020 Other chronic cystitis without hematuria: Secondary | ICD-10-CM | POA: Diagnosis not present

## 2022-06-15 DIAGNOSIS — G912 (Idiopathic) normal pressure hydrocephalus: Secondary | ICD-10-CM | POA: Diagnosis not present

## 2022-06-16 ENCOUNTER — Telehealth: Payer: Self-pay | Admitting: *Deleted

## 2022-06-16 NOTE — Telephone Encounter (Signed)
Primary Cardiologist:Jonathan Allyson Sabal, MD  Chart reviewed as part of pre-operative protocol coverage. Because of Jean Scott past medical history and time since last visit, he/she will require a follow-up visit in order to better assess preoperative cardiovascular risk.  Pre-op covering staff: - Please schedule appointment and call patient to inform them. - Please contact requesting surgeon's office via preferred method (i.e, phone, fax) to inform them of need for appointment prior to surgery.  No request to hold cardiac medications.   Levi Aland, NP-C  06/16/2022, 9:36 AM [address] Office [phone] Fax [phone]

## 2022-06-16 NOTE — Telephone Encounter (Signed)
Pt has been scheduled to see Edd Fabian, FNP 06/17/22 @ 3:35 for pre op clearance. I will update all parties. Pt said she has a CTA coming up on 06/25/22 and was not sure if she will need labs for the CT. I advised her to d/w provider at appt tomorrow. Pt thanked me for the call and the help.

## 2022-06-16 NOTE — Progress Notes (Signed)
Cardiology Clinic Note   Patient Name: Jean Scott Date of Encounter: 06/17/2022  Primary Care Provider:  Chilton Greathouse, MD Primary Cardiologist:  Nanetta Batty, MD  Patient Profile    Jean Scott 72 year old female presents to the clinic today for preoperative cardiac evaluation and follow-up of her essential hypertension.  Past Medical History    Past Medical History:  Diagnosis Date   Allergic rhinitis    Dust mites, mold, plant polens   Anemia 1970's   Arm fracture 1957   Depression 1991   6 months   Elevated uric acid in blood    Fibroids    Fibromyalgia 1990   Fibrosis, breast    Mammogram   Glucose intolerance (impaired glucose tolerance)    Grand mal seizure (HCC) 1960   Hemorrhoids, internal [id number]   History of hypercalcemia    Hyperlipidemia    Hypothyroidism 1990   Osteopenia 2012   Bone density Test   Seizures (HCC)    Urinary tract infection    Past Surgical History:  Procedure Laterality Date   ABDOMINAL HYSTERECTOMY  1989   open surgery for severe endometriosis but did initially have laproscopic   cataracts bilat     10/24/14 10/31/2014   CYSTOSCOPY     stent removal-[facility]-Jan [id number]   Laparotomy  1987   [facility]  10/12/2021   at [facility]   URETEROSCOPY     Jan 10,2017    Allergies  Allergies  Allergen Reactions   Sulfamethoxazole-Trimethoprim Other (See Comments)    Anxiety, jittery   Alprazolam Other (See Comments)    Get too groggy can't function   Chlorthalidone     Constipation    Ciprofloxacin     Pt gets dehydrated   Epinephrine     Makes pt feel like they are coming out of their skin   Potassium Citrate     Constipation; "CSF crash so I wasn't functioning well at all"    History of Present Illness    Jean Scott has a PMH of DOE, hyperlipidemia, ascending aortic aneurysm, fibromyalgia, chronic fatigue, seizure disorder, and cardiac murmur.  Her echocardiogram 05/15/2015 was normal.  She was seen in the  office by Dr. Allyson Sabal 6/21.  During that time she was noted to be somewhat hypertensive.  She was not on medication for her hypertension.  She reported shortness of breath with minimal activity.  She had repeat echocardiogram 03/07/2018.  It was normal except for some diastolic dysfunction and thoracic aortic aneurysm.  A follow-up chest CTA 6/20 showed a 4 cm aneurysm.  She had coronary calcium score which was measured to be 7.  It was felt that she was unlikely to have significant coronary disease contributing to her dyspnea.  During that time she had been placed on short acting diltiazem to help with blood pressure.  Her cholesterol was noted to be significantly elevated as well.  Her blood pressure medication was adjusted and on follow-up her blood pressure was better controlled.  She was placed on Repatha.  Her lipid panel 1/21 showed a total cholesterol of 135 and LDL of 58.  She continued to have fatigue and dyspnea on exertion.  Her echocardiogram showed G1 DD and normal LV function.  She underwent nuclear stress test which was normal.  Her chest CT showed a 4 cm ascending thoracic aortic aneurysm.  She was seen in follow-up by Dr. Allyson Sabal on 03/04/2021.  During that time she complained of leg pain.  She reported  that she had fallen off of a vanity stool the prior fall and had pain with laying and sitting as well as ambulating.  It was not felt that her symptoms were vascular in nature.  She presents to the clinic today for preoperative cardiac evaluation and follow-up evaluation.  She states her activity varies from day-to-day due to her chronic fatigue.  On some days she may be fairly active while on other days that she may be an active.  We reviewed her prior CT scans and she expressed understanding.  She has CT chest aorta scheduled for 06/25/2022.  Her blood pressure has been well-controlled.  I will send her preoperative cardiac evaluation to requesting surgeon and plan follow-up in 12 months.  Today  she denies chest pain, shortness of breath, lower extremity edema, fatigue, palpitations, melena, hematuria, hemoptysis, diaphoresis, weakness, orthopnea, and PND.    Home Medications    Prior to Admission medications   Medication Sig Start Date End Date Taking? Authorizing Provider  amphetamine-dextroamphetamine (ADDERALL) 20 MG tablet Take 20 mg in the morning and 10 mg at lunch 09/03/15   Meryl Dare, MD  anti-nausea (EMETROL) solution Take 10 mLs by mouth 2 (two) times daily. 09/03/15   Meryl Dare, MD  Biotin (SUPER BIOTIN) 5 MG CAPS Take 5 mg by mouth daily. 09/21/21   [provider]  Cholecalciferol (VITAMIN D3) 2000 units TABS Take 2,000 Units by mouth daily. 09/03/15   Meryl Dare, MD  clonazePAM (KLONOPIN) 1 MG tablet Take .75 mg at bedtime and .25 mg in the morning 09/03/15   Meryl Dare, MD  conjugated estrogens (PREMARIN) vaginal cream Place 1 Applicatorful vaginally 3 (three) times a week.    [provider]  diphenhydrAMINE-APAP, sleep, 38-500 MG TABS Take by mouth at bedtime.  Patient not taking: Reported on 03/15/2022    [provider]  doxycycline (VIBRA-TABS) 100 MG tablet Take 100 mg by mouth at bedtime. 02/28/22   [provider]  Evolocumab (REPATHA SURECLICK) 140 MG/ML SOAJ ADMINISTER 1 ML UNDER THE SKIN EVERY 14 DAYS 05/24/22   Runell Gess, MD  fluticasone [facility]) 50 MCG/ACT nasal spray Place 1 spray into both nostrils daily. 09/03/15   Meryl Dare, MD  Ibuprofen 200 MG CAPS Take by mouth.    [provider]  levothyroxine (SYNTHROID, LEVOTHROID) 88 MCG tablet Take 0.1 mcg by mouth daily before breakfast.    [provider]  Melatonin 2.5 MG CAPS Take 2.5 mg by mouth.    [provider]  mometasone (ELOCON) 0.1 % cream  09/08/13   [provider]  NON FORMULARY Take 500 mg by mouth daily. At bedtime    [provider]  Omega-3 Fatty Acids (FISH OIL PO) Take 2  capsules by mouth daily.    [provider]  omeprazole (PRILOSEC OTC) 20 MG tablet Take 20 mg by mouth as needed.    [provider]  polyethylene glycol (MIRALAX / GLYCOLAX) 17 g packet Take 17 g by mouth daily as needed.    [provider]  Probiotic Product (PROBIOTIC DAILY PO) Take 1 tablet by mouth daily.    [provider]  Red Yeast Rice Extract (RED YEAST RICE PO) Take 1 capsule by mouth daily. Patient not taking: Reported on 03/15/2022    [provider]    Family History    Family History  Problem Relation Age of Onset   Parkinson's disease Father  Deceased at 75   Hypertension Mother    Osteoporosis Mother    Breast cancer Sister    Arthritis Brother    Kidney Stones Brother    Gout Brother    Pancreatic cancer Paternal Aunt    Colon cancer Neg Hx    Colon polyps Neg Hx    She indicated that the status of her mother is unknown. She indicated that the status of her father is unknown. She indicated that the status of her sister is unknown. She indicated that the status of her paternal aunt is unknown. She indicated that the status of her neg hx is unknown.  Social History    Social History   Socioeconomic History   Marital status: Married    Spouse name: Not on file   Number of children: 0   Years of education: Not on file   Highest education level: Not on file  Occupational History   Occupation: Disabled  Tobacco Use   Smoking status: Never   Smokeless tobacco: Never  Vaping Use   Vaping Use: Never used  Substance and Sexual Activity   Alcohol use: No   Drug use: No   Sexual activity: Not on file  Other Topics Concern   Not on file  Social History Narrative   Right handed   Drinks caffeine   Two story home   Lives with husband in a two story   retired   Chief Executive Officer Determinants of Corporate investment banker Strain: Not on file  Food Insecurity: Not on file  Transportation Needs: Not on file  Physical  Activity: Not on file  Stress: Not on file  Social Connections: Not on file  Intimate Partner Violence: Not on file     Review of Systems    General:  No chills, fever, night sweats or weight changes.  Cardiovascular:  No chest pain, dyspnea on exertion, edema, orthopnea, palpitations, paroxysmal nocturnal dyspnea. Dermatological: No rash, lesions/masses Respiratory: No cough, dyspnea Urologic: No hematuria, dysuria Abdominal:   No nausea, vomiting, diarrhea, bright red blood per rectum, melena, or hematemesis Neurologic:  No visual changes, wkns, changes in mental status. All other systems reviewed and are otherwise negative except as noted above.  Physical Exam    VS:  BP 132/80 (BP Location: Left Arm, Patient Position: Sitting, Cuff Size: Normal)   Pulse 73   Wt 149 lb 12.8 oz (67.9 kg)   BMI 25.71 kg/m  , BMI Body mass index is 25.71 kg/m. GEN: Well nourished, well developed, in no acute distress. HEENT: normal. Neck: Supple, no JVD, carotid bruits, or masses. Cardiac: RRR, no murmurs, rubs, or gallops. No clubbing, cyanosis, generalized bilateral ankle edema.  Radials/DP/PT 2+ and equal bilaterally.  Respiratory:  Respirations regular and unlabored, clear to auscultation bilaterally. GI: Soft, nontender, nondistended, BS + x 4. MS: no deformity or atrophy. Skin: warm and dry, no rash. Neuro:  Strength and sensation are intact. Psych: Normal affect.  Accessory Clinical Findings    Recent Labs: 06/22/2021: BUN 15; Creatinine, Ser 0.69; Potassium 4.5; Sodium 142   Recent Lipid Panel No results found for: "CHOL", "TRIG", "HDL", "CHOLHDL", "VLDL", "LDLCALC", "LDLDIRECT"       ECG personally reviewed by me today- sinus rhythm with premature atrial complexes nonspecific ST and T wave abnormality 76 bpm- No acute changes   Echocardiogram 03/07/2018  IMPRESSIONS     1. The left ventricle has normal systolic function, with an ejection  fraction of 55-60%. The cavity  size  was normal. Left ventricular diastolic  Doppler parameters are consistent with impaired relaxation.   2. The right ventricle has normal systolic function. The cavity was  normal. There is no increase in right ventricular wall thickness. Right  ventricular systolic pressure is normal with an estimated pressure of 24.9  mmHg.   3. The mitral valve is normal in structure.   4. The tricuspid valve is normal in structure.   5. The aortic valve is tricuspid Aortic valve regurgitation is mild by  color flow Doppler. The jet is centrally-directed.   6. The pulmonic valve was normal in structure.   7. There is mild dilatation of the aortic root measuring 40 mm.   FINDINGS   Left Ventricle: The left ventricle has normal systolic function, with an  ejection fraction of 55-60%. The cavity size was normal. There is no  increase in left ventricular wall thickness. Left ventricular diastolic  Doppler parameters are consistent with  impaired relaxation Normal left ventricular filling pressures Definity  contrast agent was given IV to delineate the left ventricular endocardial  borders.  Right Ventricle: The right ventricle has normal systolic function. The  cavity was normal. There is no increase in right ventricular wall  thickness. Right ventricular systolic pressure is normal with an estimated  pressure of 24.9 mmHg.  Left Atrium: left atrial size was normal in size  Right Atrium: right atrial size was normal in size. Right atrial pressure  is estimated at 3 mmHg.  Interatrial Septum: No atrial level shunt detected by color flow Doppler.  Pericardium: There is no evidence of pericardial effusion.  Mitral Valve: The mitral valve is normal in structure. Mitral valve  regurgitation is trivial by color flow Doppler.  Tricuspid Valve: The tricuspid valve is normal in structure. Tricuspid  valve regurgitation is trivial by color flow Doppler.  Aortic Valve: The aortic valve is tricuspid Aortic  valve regurgitation is  mild by color flow Doppler. The jet is centrally-directed. There is no  evidence of aortic valve stenosis.  Pulmonic Valve: The pulmonic valve was normal in structure. Pulmonic valve  regurgitation is trivial by color flow Doppler.  Aorta: There is mild dilatation of the aortic root measuring 40 mm.  Venous: The inferior vena cava is normal in size with greater than 50%  respiratory variability.  Compared to previous exam: 05/15/15 no significant change.    Assessment & Plan   1.  DOE-stable.    Echocardiogram 03/07/2018 reassuring and unchanged. Increase physical activity as tolerated Heart healthy low-sodium diet  Thoracic aortic aneurysm-denies recent episodes of back and chest discomfort.  CT 06/2021 showed a 4 cm ascending thoracic aneurysm. Maintain good blood pressure control Repeat CT angio chest aorta 6/24  Hyperlipidemia-follows with pcp High-fiber diet Continue omega-3 fatty acids, red yeast rice  Essential hypertension-BP today 132/80 Maintain blood pressure log Low-sodium diet  Thoracic aortic aneurysm-denies recent episodes of back or chest discomfort.  CT chest aorta angio 06/26/2021 showed stable 4 cm ascending thoracic aortic aneurysm. Recommendations were made for 6/24 CT angio chest aorta  Preoperative cardiac evaluation-right parietal ventriculoperitoneal shunt, TBD, Dr. Donalee Citrin, [facility]    Primary Cardiologist: Nanetta Batty, MD  Chart reviewed as part of pre-operative protocol coverage. Given past medical history and time since last visit, based on ACC/AHA guidelines, Jean Scott would be at acceptable risk for the planned procedure without further cardiovascular testing.   Her RCRI is a class 3 risk, 6.6% risk of major cardiac event.  She is able to complete greater than 4 METS of physical activity.  Patient was advised that if she develops new symptoms prior to surgery to contact our office to arrange a  follow-up appointment.  She verbalized understanding.  I will route this recommendation to the requesting party via Epic fax function and remove from pre-op pool.     Disposition: Follow-up with Dr.Berry or me in 12 months.   Thomasene Ripple. Amro Winebarger NP-C     06/17/2022, 4:19 PM [facility] [address] Office [phone] Fax [phone]    I spent 14 minutes examining this patient, reviewing medications, and using patient centered shared decision making involving her cardiac care.  Prior to her visit I spent greater than 20 minutes reviewing her past medical history,  medications, and prior cardiac tests.

## 2022-06-16 NOTE — Telephone Encounter (Signed)
   Pre-operative Risk Assessment    Patient Name: Jean Scott  DOB: 03/18/1950 MRN: [id number]      Request for Surgical Clearance    Procedure:   RIGHT PARIETAL VENTRICULOPERITONEAL SHUNT  Date of Surgery:  Clearance TBD                                 Surgeon:  Donalee Citrin, MD Surgeon's Group or Practice Name:  [facility] Phone number:  [phone] Fax number:  [phone]   Type of Clearance Requested:   - Medical    Type of Anesthesia:  General    Additional requests/questions:    Wilhemina Cash   06/16/2022, 7:56 AM

## 2022-06-17 ENCOUNTER — Ambulatory Visit: Payer: Medicare Other | Attending: General Practice | Admitting: General Practice

## 2022-06-17 ENCOUNTER — Encounter: Payer: Self-pay | Admitting: General Practice

## 2022-06-17 VITALS — BP 132/80 | HR 73 | Wt 149.8 lb

## 2022-06-17 DIAGNOSIS — Z01810 Encounter for preprocedural cardiovascular examination: Secondary | ICD-10-CM

## 2022-06-17 DIAGNOSIS — E782 Mixed hyperlipidemia: Secondary | ICD-10-CM | POA: Diagnosis not present

## 2022-06-17 DIAGNOSIS — R0609 Other forms of dyspnea: Secondary | ICD-10-CM

## 2022-06-17 DIAGNOSIS — I712 Thoracic aortic aneurysm, without rupture, unspecified: Secondary | ICD-10-CM

## 2022-06-17 NOTE — Patient Instructions (Signed)
Medication Instructions:  The current medical regimen is effective;  continue present plan and medications as directed. Please refer to the Current Medication list given to you today.  *If you need a refill on your cardiac medications before your next appointment, please call your pharmacy*  Lab Work: NONE ordered at this time of appointment   If you have labs (blood work) drawn today and your tests are completely normal, you will receive your results only by: MyChart Message (if you have MyChart) OR  A paper copy in the mail If you have any lab test that is abnormal or we need to change your treatment, we will call you to review the results.  Testing/Procedures: NONE   Follow-Up: At [facility], you and your health needs are our priority.  As part of our continuing mission to provide you with exceptional heart care, we have created designated Provider Care Teams.  These Care Teams include your primary Cardiologist (physician) and Advanced Practice Providers (APPs -  Physician Assistants and Nurse Practitioners) who all work together to provide you with the care you need, when you need it.  Your next appointment:   12 month(s)  Provider:   Nanetta Batty, MD     Other Instructions OK FOR UPCOMING PROCEDURE

## 2022-06-21 NOTE — Addendum Note (Signed)
Addended by: Alyson Ingles on: 06/21/2022 08:35 AM   Modules accepted: Orders

## 2022-06-23 ENCOUNTER — Other Ambulatory Visit: Payer: Self-pay

## 2022-06-23 DIAGNOSIS — I712 Thoracic aortic aneurysm, without rupture, unspecified: Secondary | ICD-10-CM | POA: Diagnosis not present

## 2022-06-23 DIAGNOSIS — I7121 Aneurysm of the ascending aorta, without rupture: Secondary | ICD-10-CM | POA: Diagnosis not present

## 2022-06-24 ENCOUNTER — Other Ambulatory Visit: Payer: Self-pay

## 2022-06-24 ENCOUNTER — Other Ambulatory Visit: Payer: Self-pay | Admitting: Neurosurgery

## 2022-06-24 ENCOUNTER — Encounter (HOSPITAL_COMMUNITY): Payer: Self-pay | Admitting: Neurosurgery

## 2022-06-24 LAB — BASIC METABOLIC PANEL
BUN/Creatinine Ratio: 19 (ref 12–28)
BUN: 19 mg/dL (ref 8–27)
CO2: 23 mmol/L (ref 20–29)
Calcium: 11 mg/dL — ABNORMAL HIGH (ref 8.7–10.3)
Chloride: 104 mmol/L (ref 96–106)
Creatinine, Ser: 1.02 mg/dL — ABNORMAL HIGH (ref 0.57–1.00)
Glucose: 97 mg/dL (ref 70–99)
Potassium: 4.7 mmol/L (ref 3.5–5.2)
Sodium: 141 mmol/L (ref 134–144)
eGFR: 58 mL/min/{1.73_m2} — ABNORMAL LOW (ref >59)

## 2022-06-24 NOTE — Anesthesia Preprocedure Evaluation (Addendum)
Anesthesia Evaluation  Patient identified by MRN, date of birth, ID band Patient awake    Reviewed: Allergy & Precautions, NPO status , Patient's Chart, lab work & pertinent test results  Airway Mallampati: III  TM Distance: >3 FB Neck ROM: Full    Dental  (+) Teeth Intact, Dental Advisory Given   Pulmonary neg pulmonary ROS   Pulmonary exam normal breath sounds clear to auscultation       Cardiovascular hypertension (164/93 preop, per pt normally 135s SBP), Normal cardiovascular exam Rhythm:Regular Rate:Normal     Neuro/Psych Seizures - (childhood),  PSYCHIATRIC DISORDERS  Depression    hydrocephalus    GI/Hepatic Neg liver ROS,GERD  Medicated and Controlled,,  Endo/Other  Hypothyroidism    Renal/GU Renal InsufficiencyRenal disease (cr 1.02)  negative genitourinary   Musculoskeletal  (+) Arthritis , Osteoarthritis,  Fibromyalgia -  Abdominal   Peds  Hematology negative hematology ROS (+)   Anesthesia Other Findings   Reproductive/Obstetrics negative OB ROS                             Anesthesia Physical Anesthesia Plan  ASA: 3  Anesthesia Plan: General   Post-op Pain Management: Tylenol PO (pre-op)*   Induction: Intravenous  PONV Risk Score and Plan: 3 and Ondansetron, Dexamethasone, Midazolam and Treatment may vary due to age or medical condition  Airway Management Planned: Oral ETT  Additional Equipment: None  Intra-op Plan:   Post-operative Plan: Extubation in OR  Informed Consent: I have reviewed the patients History and Physical, chart, labs and discussed the procedure including the risks, benefits and alternatives for the proposed anesthesia with the patient or authorized representative who has indicated his/her understanding and acceptance.     Dental advisory given  Plan Discussed with: CRNA  Anesthesia Plan Comments: (  )       Anesthesia Quick  Evaluation

## 2022-06-24 NOTE — Progress Notes (Signed)
PCP - Dr Chilton Greathouse Cardiologist - Dr Nanetta Batty (Cardiac Clearance in EPIC on 06/17/22) Neurology - Nita Sickle, DO  CT Chest x-ray - 06/26/21  EKG - 06/17/22 Stress Test - 10/31/18 ECHO - 03/07/18 Cardiac Cath - n/a  ICD Pacemaker/Loop - n/a  Sleep Study -  n/a CPAP - none  Diabetes - n/a  Aspirin Instructions: Last dose was on 06/21/22.  ERAS: Clear liquids til 12:15 PM DOS.  Anesthesia review: Yes  STOP now taking any Aspirin (unless otherwise instructed by your surgeon), Aleve, Naproxen, Ibuprofen, Motrin, Advil, Goody's, BC's, all herbal medications, fish oil, and all vitamins.   Coronavirus Screening Do you have any of the following symptoms:  Cough yes/no: No Fever (>100.83F)  yes/no: No Runny nose yes/no: No Sore throat yes/no: No Difficulty breathing/shortness of breath  yes/no: No  Have you traveled in the last 14 days and where? yes/no: No  Patient verbalized understanding of instructions that were given via phone.

## 2022-06-24 NOTE — Progress Notes (Signed)
Anesthesia Chart Review:  Case: [id number] Date/Time: 06/28/22 1457   Procedure: Right - Parietal ventriculoperitoneal shunt placement (Right)   Anesthesia type: General   Pre-op diagnosis: Hydrocephalus   Location: [facility] ROOM 18 / [facility]   Surgeons: Donalee Citrin, MD       DISCUSSION: Patient is a 72 year old female scheduled for the above procedure.She has "the classic triad of normal-pressure hydrocephalus with ataxia incontinence and memory difficulties. She did get significant improvement for about 24 hours with a high volume lumbar puncture." VP shunt placement recommended.   History includes never smoker, HLD, dilated ascending thoracic aorta (4 cm 06/26/21 CTA), hypothyroidism, anemia, hypercalcemia, impaired glucose tolerance, allergic rhinitis, seizures, normal pressure hydrocephalus.   Cardiology follow-up with Edd Fabian, NP on 06/17/22 for HTN, dilated ascending thoracic aorta (stable 4 cm 06/2021 CTA), chronic DOE.  No chest pain. Coronary calcium score 7 (51 percentile) in 03/2018. Low risk stress test 102020. 03/2018 echo showed LVEF 55-60%, impaired relaxation, normal RV systolic function, RVSP normal with estimated pressure 24.9 mmHg, mild AI.  - Regarding preoperative evaluation for VP shunt: "Given past medical history and time since last visit, based on ACC/AHA guidelines, Jean Scott would be at acceptable risk for the planned procedure without further cardiovascular testing.    Her RCRI is a class 3 risk, 6.6% risk of major cardiac event.  She is able to complete greater than 4 METS of physical activity..."    She is a same day work-up, so labs as indicated and anesthesia team evaluation on the day of surgery.   VS: Ht 5\' 4"  (1.626 m)   Wt 67.9 kg   BMI 25.69 kg/m  BP Readings from Last 3 Encounters:  06/17/22 132/80  04/22/22 (!) 164/87  03/15/22 (!) 140/72   Pulse Readings from Last 3 Encounters:  06/17/22 73  04/22/22 72  03/15/22 76     PROVIDERS: Chilton Greathouse, MD is PCP  Nanetta Batty, MD is cardiologist Preminger, Sherrine Maples, MD is urologist Kateri Mc) and Bjorn Pippin, MD ([facility])  LABS: Most recent lab results in [facility] include: Lab Results  Component Value Date   WBC 7.6 05/31/2018   HGB 15.0 05/31/2018   HCT 43.8 05/31/2018   PLT 289 05/31/2018   GLUCOSE 97 06/23/2022   NA 141 06/23/2022   K 4.7 06/23/2022   CL 104 06/23/2022   CREATININE 1.02 (H) 06/23/2022   BUN 19 06/23/2022   CO2 23 06/23/2022     IMAGES: MRI Brain 08/24/21: IMPRESSION: - Chronic small-vessel ischemic changes affecting the pons and affecting the cerebral hemispheric white matter to a severe degree. Enlargement of the ventricular system, favored to be due to central atrophy and ex vacuo enlargement. However, normal pressure hydrocephalus is possible based on the imaging. - Maxillary sinusitis with mucosal thickening and layering fluid.   CTA Chest 06/26/21: MPRESSION: Ascending thoracic aorta measures 4 cm without significant interval change. - Recommend annual imaging followup by CTA or MRA. This recommendation follows 2010 ACCF/AHA/AATS/ACR/ASA/SCA/SCAI/SIR/STS/SVM Guidelines for the Diagnosis and Management of Patients with Thoracic Aortic Disease. Circulation. 2010; 121: A540-J811. Aortic aneurysm NOS (ICD10-I71.9)   EKG: EKG 06/17/22:  Sinus rhythm with premature atrial complexes Non-specific ST and T wave abnormality   CV: Nuclear stress test 10/31/18: The left ventricular ejection fraction is normal (55-65%). Nuclear stress EF: 64%. Blood pressure demonstrated a normal response to exercise. The study is normal. This is a low risk study.  Normal resting and stress perfusion. No ischemia  or infarction EF 64%    CT Coronary Calcium Score 03/07/18: IMPRESSION: 1. Coronary calcium score of 7. This was 68 percentile for age and sex matched control. 2. Ascending aortic aneurysm measuring 43 mm in the axial views. A chest CTA or  MRA is recommended for further evaluation.    Echo 03/07/18: IMPRESSIONS   1. The left ventricle has normal systolic function, with an ejection  fraction of 55-60%. The cavity size was normal. Left ventricular diastolic  Doppler parameters are consistent with impaired relaxation.   2. The right ventricle has normal systolic function. The cavity was  normal. There is no increase in right ventricular wall thickness. Right  ventricular systolic pressure is normal with an estimated pressure of 24.9  mmHg.   3. The mitral valve is normal in structure.   4. The tricuspid valve is normal in structure.   5. The aortic valve is tricuspid Aortic valve regurgitation is mild by  color flow Doppler. The jet is centrally-directed.   6. The pulmonic valve was normal in structure.   7. There is mild dilatation of the aortic root measuring 40 mm.     Past Medical History:  Diagnosis Date   Allergic rhinitis    Dust mites, mold, plant polens   Anemia 1970's   Arm fracture 1957   Depression 1991   6 months   Elevated uric acid in blood    Fibroids    Fibromyalgia 1990   Fibrosis, breast    Mammogram   Glucose intolerance (impaired glucose tolerance)    Grand mal seizure (HCC) 1960   Hemorrhoids, internal 10/12/2005   History of hypercalcemia    Hyperlipidemia    Hypothyroidism 1990   Osteopenia 2012   Bone density Test   Seizures (HCC)    Urinary tract infection     Past Surgical History:  Procedure Laterality Date   ABDOMINAL HYSTERECTOMY  1989   open surgery for severe endometriosis but did initially have laproscopic   cataracts bilat     10/24/14 10/31/2014   CYSTOSCOPY     stent removal-[facility]-Jan 96,0454   Laparotomy  1987   PCNL  10/12/2021   at [facility] for kidney stones   URETEROSCOPY     Jan 10,2017    MEDICATIONS: No current facility-administered medications for this encounter.    amphetamine-dextroamphetamine (ADDERALL) 20 MG tablet   ampicillin (PRINCIPEN) 500  MG capsule   anti-nausea (EMETROL) solution   aspirin EC 325 MG tablet   Biotin 5000 MCG TABS   Cholecalciferol (VITAMIN D3) 2000 units TABS   clonazePAM (KLONOPIN) 1 MG tablet   conjugated estrogens (PREMARIN) vaginal cream   diphenhydrAMINE-APAP, sleep, 38-500 MG TABS   Evolocumab (REPATHA SURECLICK) 140 MG/ML SOAJ   fluticasone (FLONASE) 50 MCG/ACT nasal spray   levothyroxine (SYNTHROID) 100 MCG tablet   Melatonin 2.5 MG CAPS   mometasone (ELOCON) 0.1 % cream   Omega-3 Fatty Acids (FISH OIL) 1200 MG CAPS   omeprazole (PRILOSEC OTC) 20 MG tablet   OVER THE COUNTER MEDICATION   polyethylene glycol (MIRALAX / GLYCOLAX) 17 g packet   potassium bicarbonate (K-LYTE) 25 MEQ disintegrating tablet   Potassium Citrate-Mag Citrate (LITHOLYTE PO)   Probiotic Product (PROBIOTIC DAILY PO)   simethicone (MYLICON) 80 MG chewable tablet  Perioperative ASA instructions per surgeon.  Appear she has been on chronic ampicillin for nephrolithiasis/UTI.   Shonna Chock, PA-C Surgical Short Stay/Anesthesiology [facility] Phone [phone] [facility] Phone [phone] 06/24/2022 5:59 PM

## 2022-06-25 ENCOUNTER — Ambulatory Visit (HOSPITAL_BASED_OUTPATIENT_CLINIC_OR_DEPARTMENT_OTHER)
Admission: RE | Admit: 2022-06-25 | Discharge: 2022-06-25 | Disposition: A | Discharge status: Home / Self Care | Payer: Medicare Other | Source: Ambulatory Visit | Attending: Cardiovascular Disease | Admitting: Cardiovascular Disease

## 2022-06-25 DIAGNOSIS — I7121 Aneurysm of the ascending aorta, without rupture: Secondary | ICD-10-CM | POA: Insufficient documentation

## 2022-06-25 DIAGNOSIS — Z8262 Family history of osteoporosis: Secondary | ICD-10-CM | POA: Diagnosis not present

## 2022-06-25 DIAGNOSIS — G919 Hydrocephalus, unspecified: Secondary | ICD-10-CM | POA: Diagnosis not present

## 2022-06-25 DIAGNOSIS — I6782 Cerebral ischemia: Secondary | ICD-10-CM | POA: Diagnosis not present

## 2022-06-25 DIAGNOSIS — K219 Gastro-esophageal reflux disease without esophagitis: Secondary | ICD-10-CM | POA: Diagnosis not present

## 2022-06-25 DIAGNOSIS — Z8249 Family history of ischemic heart disease and other diseases of the circulatory system: Secondary | ICD-10-CM | POA: Diagnosis not present

## 2022-06-25 DIAGNOSIS — E039 Hypothyroidism, unspecified: Secondary | ICD-10-CM | POA: Diagnosis not present

## 2022-06-25 DIAGNOSIS — Z882 Allergy status to sulfonamides status: Secondary | ICD-10-CM | POA: Diagnosis not present

## 2022-06-25 DIAGNOSIS — Z820 Family history of epilepsy and other diseases of the nervous system: Secondary | ICD-10-CM | POA: Diagnosis not present

## 2022-06-25 DIAGNOSIS — Z803 Family history of malignant neoplasm of breast: Secondary | ICD-10-CM | POA: Diagnosis not present

## 2022-06-25 DIAGNOSIS — G912 (Idiopathic) normal pressure hydrocephalus: Secondary | ICD-10-CM | POA: Diagnosis not present

## 2022-06-25 DIAGNOSIS — E785 Hyperlipidemia, unspecified: Secondary | ICD-10-CM | POA: Diagnosis not present

## 2022-06-25 DIAGNOSIS — Z881 Allergy status to other antibiotic agents status: Secondary | ICD-10-CM | POA: Diagnosis not present

## 2022-06-25 DIAGNOSIS — I712 Thoracic aortic aneurysm, without rupture, unspecified: Secondary | ICD-10-CM

## 2022-06-25 DIAGNOSIS — Z888 Allergy status to other drugs, medicaments and biological substances status: Secondary | ICD-10-CM | POA: Diagnosis not present

## 2022-06-25 DIAGNOSIS — M797 Fibromyalgia: Secondary | ICD-10-CM | POA: Diagnosis not present

## 2022-06-25 DIAGNOSIS — Z90710 Acquired absence of both cervix and uterus: Secondary | ICD-10-CM | POA: Diagnosis not present

## 2022-06-25 DIAGNOSIS — I495 Sick sinus syndrome: Secondary | ICD-10-CM | POA: Diagnosis not present

## 2022-06-25 DIAGNOSIS — Z8261 Family history of arthritis: Secondary | ICD-10-CM | POA: Diagnosis not present

## 2022-06-25 DIAGNOSIS — G9389 Other specified disorders of brain: Secondary | ICD-10-CM | POA: Diagnosis not present

## 2022-06-25 DIAGNOSIS — Z79899 Other long term (current) drug therapy: Secondary | ICD-10-CM | POA: Diagnosis not present

## 2022-06-25 DIAGNOSIS — Z800 Family history of malignant neoplasm of digestive organs: Secondary | ICD-10-CM | POA: Diagnosis not present

## 2022-06-25 DIAGNOSIS — Z79890 Hormone replacement therapy (postmenopausal): Secondary | ICD-10-CM | POA: Diagnosis not present

## 2022-06-25 DIAGNOSIS — E7439 Other disorders of intestinal carbohydrate absorption: Secondary | ICD-10-CM | POA: Diagnosis not present

## 2022-06-25 DIAGNOSIS — I10 Essential (primary) hypertension: Secondary | ICD-10-CM | POA: Diagnosis not present

## 2022-06-25 DIAGNOSIS — Z7982 Long term (current) use of aspirin: Secondary | ICD-10-CM | POA: Diagnosis not present

## 2022-06-25 MED ORDER — IOHEXOL 350 MG/ML SOLN
100.0000 mL | Freq: Once | INTRAVENOUS | Status: AC | PRN
  Administered 2022-06-25: 100 mL via INTRAVENOUS

## 2022-06-28 ENCOUNTER — Encounter (HOSPITAL_COMMUNITY)
Admission: RE | Disposition: A | Discharge status: Home / Self Care | Payer: Self-pay | Source: Home / Self Care | Attending: Neurosurgery

## 2022-06-28 ENCOUNTER — Inpatient Hospital Stay (HOSPITAL_COMMUNITY): Payer: Medicare Other | Admitting: Vascular Surgery

## 2022-06-28 ENCOUNTER — Other Ambulatory Visit: Payer: Self-pay

## 2022-06-28 ENCOUNTER — Inpatient Hospital Stay (HOSPITAL_COMMUNITY): Payer: Medicare Other

## 2022-06-28 ENCOUNTER — Inpatient Hospital Stay (HOSPITAL_COMMUNITY)
Admission: RE | Admit: 2022-06-28 | Discharge: 2022-06-30 | DRG: 033 | Disposition: A | Discharge status: Home / Self Care | Payer: Medicare Other | Attending: Neurosurgery | Admitting: Neurosurgery

## 2022-06-28 ENCOUNTER — Encounter (HOSPITAL_COMMUNITY): Payer: Self-pay | Admitting: Neurosurgery

## 2022-06-28 DIAGNOSIS — Z800 Family history of malignant neoplasm of digestive organs: Secondary | ICD-10-CM | POA: Diagnosis not present

## 2022-06-28 DIAGNOSIS — E039 Hypothyroidism, unspecified: Secondary | ICD-10-CM | POA: Diagnosis present

## 2022-06-28 DIAGNOSIS — Z8261 Family history of arthritis: Secondary | ICD-10-CM

## 2022-06-28 DIAGNOSIS — Z882 Allergy status to sulfonamides status: Secondary | ICD-10-CM | POA: Diagnosis not present

## 2022-06-28 DIAGNOSIS — E7439 Other disorders of intestinal carbohydrate absorption: Secondary | ICD-10-CM | POA: Diagnosis present

## 2022-06-28 DIAGNOSIS — Z79899 Other long term (current) drug therapy: Secondary | ICD-10-CM

## 2022-06-28 DIAGNOSIS — M797 Fibromyalgia: Secondary | ICD-10-CM

## 2022-06-28 DIAGNOSIS — Z90710 Acquired absence of both cervix and uterus: Secondary | ICD-10-CM

## 2022-06-28 DIAGNOSIS — Z881 Allergy status to other antibiotic agents status: Secondary | ICD-10-CM

## 2022-06-28 DIAGNOSIS — I495 Sick sinus syndrome: Secondary | ICD-10-CM | POA: Diagnosis present

## 2022-06-28 DIAGNOSIS — K219 Gastro-esophageal reflux disease without esophagitis: Secondary | ICD-10-CM | POA: Diagnosis present

## 2022-06-28 DIAGNOSIS — I10 Essential (primary) hypertension: Secondary | ICD-10-CM

## 2022-06-28 DIAGNOSIS — Z803 Family history of malignant neoplasm of breast: Secondary | ICD-10-CM

## 2022-06-28 DIAGNOSIS — E785 Hyperlipidemia, unspecified: Secondary | ICD-10-CM | POA: Diagnosis present

## 2022-06-28 DIAGNOSIS — Z7982 Long term (current) use of aspirin: Secondary | ICD-10-CM

## 2022-06-28 DIAGNOSIS — Z79890 Hormone replacement therapy (postmenopausal): Secondary | ICD-10-CM | POA: Diagnosis not present

## 2022-06-28 DIAGNOSIS — Z8262 Family history of osteoporosis: Secondary | ICD-10-CM | POA: Diagnosis not present

## 2022-06-28 DIAGNOSIS — Z820 Family history of epilepsy and other diseases of the nervous system: Secondary | ICD-10-CM

## 2022-06-28 DIAGNOSIS — Z888 Allergy status to other drugs, medicaments and biological substances status: Secondary | ICD-10-CM

## 2022-06-28 DIAGNOSIS — Z8249 Family history of ischemic heart disease and other diseases of the circulatory system: Secondary | ICD-10-CM | POA: Diagnosis not present

## 2022-06-28 DIAGNOSIS — G912 (Idiopathic) normal pressure hydrocephalus: Secondary | ICD-10-CM | POA: Diagnosis present

## 2022-06-28 DIAGNOSIS — G919 Hydrocephalus, unspecified: Secondary | ICD-10-CM | POA: Diagnosis not present

## 2022-06-28 HISTORY — DX: Gastro-esophageal reflux disease without esophagitis: K21.9

## 2022-06-28 HISTORY — DX: Unspecified osteoarthritis, unspecified site: M19.90

## 2022-06-28 HISTORY — PX: VENTRICULOPERITONEAL SHUNT: SHX204

## 2022-06-28 HISTORY — DX: Personal history of urinary calculi: Z87.442

## 2022-06-28 LAB — CBC
HCT: 45.3 % (ref 36.0–46.0)
Hemoglobin: 13.3 g/dL (ref 12.0–15.0)
MCH: 28.2 pg (ref 26.0–34.0)
MCHC: 29.4 g/dL — ABNORMAL LOW (ref 30.0–36.0)
MCV: 96.2 fL (ref 80.0–100.0)
Platelets: 220 10*3/uL (ref 150–400)
RBC: 4.71 MIL/uL (ref 3.87–5.11)
RDW: 13 % (ref 11.5–15.5)
WBC: 5.1 10*3/uL (ref 4.0–10.5)
nRBC: 0 % (ref 0.0–0.2)

## 2022-06-28 LAB — MRSA NEXT GEN BY PCR, NASAL: MRSA by PCR Next Gen: NOT DETECTED

## 2022-06-28 SURGERY — SHUNT INSERTION VENTRICULAR-PERITONEAL
Anesthesia: General | Laterality: Right

## 2022-06-28 MED ORDER — HYDROCODONE-ACETAMINOPHEN 5-325 MG PO TABS
2.0000 | ORAL_TABLET | ORAL | Status: DC | PRN
  Administered 2022-06-28 – 2022-06-30 (×5): 2 via ORAL
  Filled 2022-06-28 (×5): qty 2

## 2022-06-28 MED ORDER — LIDOCAINE 2% (20 MG/ML) 5 ML SYRINGE
INTRAMUSCULAR | Status: DC | PRN
  Administered 2022-06-28: 60 mg via INTRAVENOUS

## 2022-06-28 MED ORDER — ALUM & MAG HYDROXIDE-SIMETH 200-200-20 MG/5ML PO SUSP: 30.0000 mL | Freq: Four times a day (QID) | ORAL | Status: DC | PRN

## 2022-06-28 MED ORDER — ROCURONIUM BROMIDE 10 MG/ML (PF) SYRINGE
PREFILLED_SYRINGE | INTRAVENOUS | Status: DC | PRN
  Administered 2022-06-28: 50 mg via INTRAVENOUS

## 2022-06-28 MED ORDER — OMEPRAZOLE MAGNESIUM 20 MG PO TBEC: 20.0000 mg | DELAYED_RELEASE_TABLET | Freq: Every day | ORAL | Status: DC | PRN

## 2022-06-28 MED ORDER — SODIUM CHLORIDE 0.9 % IV SOLN
250.0000 mL | INTRAVENOUS | Status: DC
  Administered 2022-06-28: 250 mL via INTRAVENOUS

## 2022-06-28 MED ORDER — CLONAZEPAM 0.5 MG PO TABS: 0.5000 mg | ORAL_TABLET | ORAL | Status: DC

## 2022-06-28 MED ORDER — FENTANYL CITRATE (PF) 100 MCG/2ML IJ SOLN
25.0000 ug | INTRAMUSCULAR | Status: DC | PRN
  Administered 2022-06-28 (×2): 25 ug via INTRAVENOUS

## 2022-06-28 MED ORDER — PROPOFOL 500 MG/50ML IV EMUL
INTRAVENOUS | Status: DC | PRN
  Administered 2022-06-28: 25 ug/kg/min via INTRAVENOUS

## 2022-06-28 MED ORDER — DEXAMETHASONE SODIUM PHOSPHATE 10 MG/ML IJ SOLN
INTRAMUSCULAR | Status: AC
  Filled 2022-06-28: qty 1

## 2022-06-28 MED ORDER — LIDOCAINE HCL 1 % IJ SOLN
INTRAMUSCULAR | Status: AC
  Filled 2022-06-28: qty 20

## 2022-06-28 MED ORDER — PHENYLEPHRINE 80 MCG/ML (10ML) SYRINGE FOR IV PUSH (FOR BLOOD PRESSURE SUPPORT)
PREFILLED_SYRINGE | INTRAVENOUS | Status: DC | PRN
  Administered 2022-06-28 (×5): 80 ug via INTRAVENOUS

## 2022-06-28 MED ORDER — POTASSIUM CHLORIDE CRYS ER 10 MEQ PO TBCR: 10.0000 meq | EXTENDED_RELEASE_TABLET | Freq: Once | ORAL | Status: DC

## 2022-06-28 MED ORDER — AMPHETAMINE-DEXTROAMPHETAMINE 10 MG PO TABS: 20.0000 mg | ORAL_TABLET | Freq: Every day | ORAL | Status: DC

## 2022-06-28 MED ORDER — PANTOPRAZOLE SODIUM 40 MG IV SOLR
40.0000 mg | Freq: Every day | INTRAVENOUS | Status: DC
  Administered 2022-06-28 – 2022-06-29 (×2): 40 mg via INTRAVENOUS
  Filled 2022-06-28 (×2): qty 10

## 2022-06-28 MED ORDER — SODIUM CHLORIDE 0.9% FLUSH
3.0000 mL | Freq: Two times a day (BID) | INTRAVENOUS | Status: DC
  Administered 2022-06-28 – 2022-06-30 (×4): 3 mL via INTRAVENOUS

## 2022-06-28 MED ORDER — ONDANSETRON HCL 4 MG/2ML IJ SOLN
INTRAMUSCULAR | Status: DC | PRN
  Administered 2022-06-28: 4 mg via INTRAVENOUS

## 2022-06-28 MED ORDER — ESTROGENS, CONJUGATED 0.625 MG/GM VA CREA: 1.0000 | TOPICAL_CREAM | VAGINAL | Status: DC

## 2022-06-28 MED ORDER — CYCLOBENZAPRINE HCL 10 MG PO TABS: 10.0000 mg | ORAL_TABLET | Freq: Three times a day (TID) | ORAL | Status: DC | PRN

## 2022-06-28 MED ORDER — ONDANSETRON HCL 4 MG/2ML IJ SOLN: 4.0000 mg | Freq: Once | INTRAMUSCULAR | Status: DC | PRN

## 2022-06-28 MED ORDER — OXYCODONE HCL 5 MG/5ML PO SOLN: 5.0000 mg | Freq: Once | ORAL | Status: DC | PRN

## 2022-06-28 MED ORDER — AMPICILLIN 500 MG PO CAPS
500.0000 mg | ORAL_CAPSULE | Freq: Every day | ORAL | Status: DC
  Administered 2022-06-28: 500 mg via ORAL
  Filled 2022-06-28 (×5): qty 1

## 2022-06-28 MED ORDER — ACETAMINOPHEN 650 MG RE SUPP: 650.0000 mg | RECTAL | Status: DC | PRN

## 2022-06-28 MED ORDER — CLONAZEPAM 0.25 MG PO TBDP
0.2500 mg | ORAL_TABLET | Freq: Every day | ORAL | Status: DC
  Administered 2022-06-29 – 2022-06-30 (×2): 0.25 mg via ORAL
  Filled 2022-06-28 (×2): qty 1

## 2022-06-28 MED ORDER — 0.9 % SODIUM CHLORIDE (POUR BTL) OPTIME
TOPICAL | Status: DC | PRN
  Administered 2022-06-28: 1000 mL

## 2022-06-28 MED ORDER — LIDOCAINE-EPINEPHRINE 1 %-1:100000 IJ SOLN
INTRAMUSCULAR | Status: AC
  Filled 2022-06-28: qty 1

## 2022-06-28 MED ORDER — MENTHOL 3 MG MT LOZG: 1.0000 | LOZENGE | OROMUCOSAL | Status: DC | PRN

## 2022-06-28 MED ORDER — LIDOCAINE HCL (PF) 1 % IJ SOLN
INTRAMUSCULAR | Status: DC | PRN
  Administered 2022-06-28: 10 mL

## 2022-06-28 MED ORDER — DIPHENHYDRAMINE-APAP (SLEEP) 38-500 MG PO TABS: 1.0000 | ORAL_TABLET | Freq: Every day | ORAL | Status: DC

## 2022-06-28 MED ORDER — CLONAZEPAM 0.25 MG PO TBDP
0.7500 mg | ORAL_TABLET | Freq: Every day | ORAL | Status: DC
  Administered 2022-06-28 – 2022-06-29 (×2): 0.75 mg via ORAL
  Filled 2022-06-28 (×2): qty 3

## 2022-06-28 MED ORDER — EPHEDRINE SULFATE-NACL 50-0.9 MG/10ML-% IV SOSY
PREFILLED_SYRINGE | INTRAVENOUS | Status: DC | PRN
  Administered 2022-06-28: 10 mg via INTRAVENOUS
  Administered 2022-06-28: 5 mg via INTRAVENOUS

## 2022-06-28 MED ORDER — ACETAMINOPHEN 500 MG PO TABS
1000.0000 mg | ORAL_TABLET | Freq: Once | ORAL | Status: AC
  Administered 2022-06-28: 1000 mg via ORAL
  Filled 2022-06-28: qty 2

## 2022-06-28 MED ORDER — SUGAMMADEX SODIUM 200 MG/2ML IV SOLN
INTRAVENOUS | Status: DC | PRN
  Administered 2022-06-28 (×2): 100 mg via INTRAVENOUS

## 2022-06-28 MED ORDER — FLUTICASONE PROPIONATE 50 MCG/ACT NA SUSP
1.0000 | Freq: Every day | NASAL | Status: DC
  Administered 2022-06-29 – 2022-06-30 (×2): 1 via NASAL
  Filled 2022-06-28 (×2): qty 16

## 2022-06-28 MED ORDER — CEFAZOLIN SODIUM-DEXTROSE 2-3 GM-%(50ML) IV SOLR
INTRAVENOUS | Status: DC | PRN
  Administered 2022-06-28: 2 g via INTRAVENOUS

## 2022-06-28 MED ORDER — ORAL CARE MOUTH RINSE: 15.0000 mL | Freq: Once | OROMUCOSAL | Status: AC

## 2022-06-28 MED ORDER — POTASSIUM CITRATE-MAG CITRATE 400-60 MG PO PACK: PACK | Freq: Every morning | ORAL | Status: DC

## 2022-06-28 MED ORDER — ONDANSETRON HCL 4 MG/2ML IJ SOLN: 4.0000 mg | Freq: Four times a day (QID) | INTRAMUSCULAR | Status: DC | PRN

## 2022-06-28 MED ORDER — PROPOFOL 10 MG/ML IV BOLUS
INTRAVENOUS | Status: AC
  Filled 2022-06-28: qty 20

## 2022-06-28 MED ORDER — OXYCODONE HCL 5 MG PO TABS: 5.0000 mg | ORAL_TABLET | Freq: Once | ORAL | Status: DC | PRN

## 2022-06-28 MED ORDER — CHLORHEXIDINE GLUCONATE 0.12 % MT SOLN
15.0000 mL | Freq: Once | OROMUCOSAL | Status: AC
  Administered 2022-06-28: 15 mL via OROMUCOSAL
  Filled 2022-06-28: qty 15

## 2022-06-28 MED ORDER — ACETAMINOPHEN 325 MG PO TABS: 650.0000 mg | ORAL_TABLET | ORAL | Status: DC | PRN

## 2022-06-28 MED ORDER — BIOTIN 5000 MCG PO TABS: 5000.0000 ug | ORAL_TABLET | Freq: Every day | ORAL | Status: DC

## 2022-06-28 MED ORDER — ONDANSETRON HCL 4 MG PO TABS: 4.0000 mg | ORAL_TABLET | Freq: Four times a day (QID) | ORAL | Status: DC | PRN

## 2022-06-28 MED ORDER — BACITRACIN ZINC 500 UNIT/GM EX OINT
TOPICAL_OINTMENT | CUTANEOUS | Status: AC
  Filled 2022-06-28: qty 28.35

## 2022-06-28 MED ORDER — LEVOTHYROXINE SODIUM 100 MCG PO TABS
100.0000 ug | ORAL_TABLET | Freq: Every day | ORAL | Status: DC
  Administered 2022-06-28 – 2022-06-29 (×2): 100 ug via ORAL
  Filled 2022-06-28: qty 2
  Filled 2022-06-28 (×2): qty 1

## 2022-06-28 MED ORDER — SIMETHICONE 80 MG PO CHEW: 80.0000 mg | CHEWABLE_TABLET | Freq: Four times a day (QID) | ORAL | Status: DC | PRN

## 2022-06-28 MED ORDER — LACTATED RINGERS IV SOLN: INTRAVENOUS | Status: DC

## 2022-06-28 MED ORDER — HYDROMORPHONE HCL 1 MG/ML IJ SOLN
0.5000 mg | INTRAMUSCULAR | Status: DC | PRN
  Administered 2022-06-28 – 2022-06-29 (×3): 0.5 mg via INTRAVENOUS
  Filled 2022-06-28 (×3): qty 1

## 2022-06-28 MED ORDER — AMPHETAMINE-DEXTROAMPHETAMINE 10 MG PO TABS
20.0000 mg | ORAL_TABLET | Freq: Every day | ORAL | Status: DC
  Administered 2022-06-29 – 2022-06-30 (×2): 20 mg via ORAL
  Filled 2022-06-28 (×2): qty 2

## 2022-06-28 MED ORDER — POLYETHYLENE GLYCOL 3350 17 G PO PACK: 17.0000 g | PACK | Freq: Every day | ORAL | Status: DC | PRN

## 2022-06-28 MED ORDER — FENTANYL CITRATE (PF) 250 MCG/5ML IJ SOLN
INTRAMUSCULAR | Status: AC
  Filled 2022-06-28: qty 5

## 2022-06-28 MED ORDER — THROMBIN 5000 UNITS EX SOLR
CUTANEOUS | Status: AC
  Filled 2022-06-28: qty 5000

## 2022-06-28 MED ORDER — LIDOCAINE 2% (20 MG/ML) 5 ML SYRINGE
INTRAMUSCULAR | Status: AC
  Filled 2022-06-28: qty 5

## 2022-06-28 MED ORDER — DEXAMETHASONE SODIUM PHOSPHATE 10 MG/ML IJ SOLN
INTRAMUSCULAR | Status: DC | PRN
  Administered 2022-06-28: 5 mg via INTRAVENOUS

## 2022-06-28 MED ORDER — CEFAZOLIN SODIUM-DEXTROSE 2-4 GM/100ML-% IV SOLN
2.0000 g | Freq: Three times a day (TID) | INTRAVENOUS | Status: AC
  Administered 2022-06-28 – 2022-06-29 (×2): 2 g via INTRAVENOUS
  Filled 2022-06-28 (×2): qty 100

## 2022-06-28 MED ORDER — PROBIOTIC DAILY PO CAPS: ORAL_CAPSULE | Freq: Every day | ORAL | Status: DC

## 2022-06-28 MED ORDER — BACITRACIN ZINC 500 UNIT/GM EX OINT
TOPICAL_OINTMENT | CUTANEOUS | Status: DC | PRN
  Administered 2022-06-28: 1 via TOPICAL

## 2022-06-28 MED ORDER — FENTANYL CITRATE (PF) 250 MCG/5ML IJ SOLN
INTRAMUSCULAR | Status: DC | PRN
  Administered 2022-06-28 (×2): 50 ug via INTRAVENOUS

## 2022-06-28 MED ORDER — MELATONIN 3 MG PO TABS
3.0000 mg | ORAL_TABLET | Freq: Every day | ORAL | Status: DC
  Administered 2022-06-28 – 2022-06-29 (×2): 3 mg via ORAL
  Filled 2022-06-28 (×2): qty 1

## 2022-06-28 MED ORDER — PHENOL 1.4 % MT LIQD: 1.0000 | OROMUCOSAL | Status: DC | PRN

## 2022-06-28 MED ORDER — ASPIRIN 325 MG PO TBEC: 650.0000 mg | DELAYED_RELEASE_TABLET | Freq: Every day | ORAL | Status: DC | PRN

## 2022-06-28 MED ORDER — FENTANYL CITRATE (PF) 100 MCG/2ML IJ SOLN
INTRAMUSCULAR | Status: AC
  Filled 2022-06-28: qty 2

## 2022-06-28 MED ORDER — THROMBIN 5000 UNITS EX SOLR
CUTANEOUS | Status: DC | PRN
  Administered 2022-06-28: 5000 [IU] via TOPICAL

## 2022-06-28 MED ORDER — EMETROL 1.87-1.87-21.5 PO SOLN: 10.0000 mL | ORAL | Status: DC | PRN

## 2022-06-28 MED ORDER — MOMETASONE FUROATE 0.1 % EX CREA: 1.0000 | TOPICAL_CREAM | Freq: Every day | CUTANEOUS | Status: DC | PRN

## 2022-06-28 MED ORDER — AMISULPRIDE (ANTIEMETIC) 5 MG/2ML IV SOLN: 10.0000 mg | Freq: Once | INTRAVENOUS | Status: DC | PRN

## 2022-06-28 MED ORDER — ROCURONIUM BROMIDE 10 MG/ML (PF) SYRINGE
PREFILLED_SYRINGE | INTRAVENOUS | Status: AC
  Filled 2022-06-28: qty 10

## 2022-06-28 MED ORDER — SODIUM CHLORIDE 0.9% FLUSH: 3.0000 mL | INTRAVENOUS | Status: DC | PRN

## 2022-06-28 MED ORDER — AMPHETAMINE-DEXTROAMPHETAMINE 10 MG PO TABS
10.0000 mg | ORAL_TABLET | Freq: Every day | ORAL | Status: DC
  Administered 2022-06-29 – 2022-06-30 (×2): 10 mg via ORAL
  Filled 2022-06-28 (×2): qty 1

## 2022-06-28 MED ORDER — ONDANSETRON HCL 4 MG/2ML IJ SOLN
INTRAMUSCULAR | Status: AC
  Filled 2022-06-28: qty 2

## 2022-06-28 MED ORDER — PROPOFOL 10 MG/ML IV BOLUS
INTRAVENOUS | Status: DC | PRN
  Administered 2022-06-28: 120 mg via INTRAVENOUS

## 2022-06-28 MED ORDER — CHLORHEXIDINE GLUCONATE CLOTH 2 % EX PADS
6.0000 | MEDICATED_PAD | Freq: Every day | CUTANEOUS | Status: DC
  Administered 2022-06-28 – 2022-06-29 (×2): 6 via TOPICAL

## 2022-06-28 SURGICAL SUPPLY — 57 items
ADH SKN CLS APL DERMABOND .7 (GAUZE/BANDAGES/DRESSINGS) ×1
ADH SKN CLS LQ APL DERMABOND (GAUZE/BANDAGES/DRESSINGS) ×2
ADH SKNCLS APL OCTYL .7 VIOL (GAUZE/BANDAGES/DRESSINGS) ×1
ADH SKNCLS LQ APL MCBL BRR (GAUZE/BANDAGES/DRESSINGS) ×2
APL SKNCLS NONHYPOALLERGENIC (GAUZE/BANDAGES/DRESSINGS) ×2
APL SKNCLS STERI-STRIP NONHPOA (GAUZE/BANDAGES/DRESSINGS) ×2
BAG COUNTER SPONGE SURGICOUNT (BAG) ×1 IMPLANT
BAG SPNG CNTER NS LX DISP (BAG) ×1
BENZOIN TINCTURE PRP APPL 2/3 (GAUZE/BANDAGES/DRESSINGS) ×1 IMPLANT
BLADE SURG 11 STRL SS (BLADE) ×1 IMPLANT
BUR ACORN 6.0 PRECISION (BURR) ×1 IMPLANT
CABLE BIPOLOR RESECTION CORD (MISCELLANEOUS) ×1 IMPLANT
CANISTER SUCT 3000ML PPV (MISCELLANEOUS) ×1 IMPLANT
CLSR STERI-STRIP ANTIMIC 1/2X4 (GAUZE/BANDAGES/DRESSINGS) IMPLANT
DERMABOND ADVANCED .7 DNX12 (GAUZE/BANDAGES/DRESSINGS) ×1 IMPLANT
DERMABOND ADVANCED .7 DNX6 (GAUZE/BANDAGES/DRESSINGS) IMPLANT
DRAPE INCISE IOBAN 85X60 (DRAPES) ×1 IMPLANT
DRAPE ORTHO SPLIT 77X108 STRL (DRAPES) ×2
DRAPE POUCH INSTRU U-SHP 10X18 (DRAPES) ×1 IMPLANT
DRAPE SURG ORHT 6 SPLT 77X108 (DRAPES) ×2 IMPLANT
DRSG OPSITE 4X5.5 SM (GAUZE/BANDAGES/DRESSINGS) IMPLANT
DRSG OPSITE POSTOP 4X6 (GAUZE/BANDAGES/DRESSINGS) IMPLANT
ELECT REM PT RETURN 9FT ADLT (ELECTROSURGICAL) ×1
ELECTRODE REM PT RTRN 9FT ADLT (ELECTROSURGICAL) ×1 IMPLANT
GLOVE BIO SURGEON STRL SZ8 (GLOVE) ×1 IMPLANT
GLOVE INDICATOR 8.5 STRL (GLOVE) ×2 IMPLANT
GOWN STRL REUS W/ TWL LRG LVL3 (GOWN DISPOSABLE) ×1 IMPLANT
GOWN STRL REUS W/ TWL XL LVL3 (GOWN DISPOSABLE) ×1 IMPLANT
GOWN STRL REUS W/TWL 2XL LVL3 (GOWN DISPOSABLE) ×1 IMPLANT
GOWN STRL REUS W/TWL LRG LVL3 (GOWN DISPOSABLE) ×1
GOWN STRL REUS W/TWL XL LVL3 (GOWN DISPOSABLE) ×1
KIT BASIN OR (CUSTOM PROCEDURE TRAY) ×1 IMPLANT
KIT TURNOVER KIT B (KITS) ×1 IMPLANT
NDL HYPO 25X1 1.5 SAFETY (NEEDLE) ×1 IMPLANT
NEEDLE HYPO 25X1 1.5 SAFETY (NEEDLE) ×1 IMPLANT
NS IRRIG 1000ML POUR BTL (IV SOLUTION) ×1 IMPLANT
PACK LAMINECTOMY NEURO (CUSTOM PROCEDURE TRAY) ×1 IMPLANT
PAD ARMBOARD 7.5X6 YLW CONV (MISCELLANEOUS) ×1 IMPLANT
SHEATH PERITONEAL INTRO 61 (SHEATH) ×1 IMPLANT
SHUNT STRATA 11 SNAP REG (Shunt) IMPLANT
SOL ELECTROSURG ANTI STICK (MISCELLANEOUS) ×1
SOLUTION ELECTROSURG ANTI STCK (MISCELLANEOUS) ×1 IMPLANT
SPIKE FLUID TRANSFER (MISCELLANEOUS) ×1 IMPLANT
SPONGE SURGIFOAM ABS GEL SZ50 (HEMOSTASIS) ×1 IMPLANT
STAPLER VISISTAT 35W (STAPLE) ×1 IMPLANT
STRIP CLOSURE SKIN 1/2X4 (GAUZE/BANDAGES/DRESSINGS) ×2 IMPLANT
SUT BONE WAX W31G (SUTURE) ×1 IMPLANT
SUT CHROMIC 3 0 PS 2 (SUTURE) IMPLANT
SUT SILK 0 TIES 10X30 (SUTURE) ×1 IMPLANT
SUT VIC AB 2-0 CT1 18 (SUTURE) ×1 IMPLANT
SUT VIC AB 4-0 PS2 27 (SUTURE) IMPLANT
SUT VICRYL 4-0 PS2 18IN ABS (SUTURE) ×1 IMPLANT
TOWEL GREEN STERILE (TOWEL DISPOSABLE) ×1 IMPLANT
TOWEL GREEN STERILE FF (TOWEL DISPOSABLE) ×1 IMPLANT
TUBE EAR ARMSTRONG FL 1.14X3.5 (OTOLOGIC RELATED) IMPLANT
UNDERPAD 30X36 HEAVY ABSORB (UNDERPADS AND DIAPERS) ×1 IMPLANT
WATER STERILE IRR 1000ML POUR (IV SOLUTION) ×1 IMPLANT

## 2022-06-28 NOTE — Progress Notes (Signed)
Neurosurgery Paged d/t pt continuing to have uncontrolled head pain post procedure. PRN pain meds given with no decrease in discomfort. Awaiting orders from Neurosurgery

## 2022-06-28 NOTE — H&P (Signed)
Jean Scott is an 72 y.o. female.   Chief Complaint: Patient presents with memory difficulties difficulty walking balance difficulties and incontinence HPI: 72 year old patient presented with the above symptoms workup revealed significant ventriculomegaly in excess and will be expected with cortical atrophy with transependymal absorption.  She gets symptomatic improvement with a high-volume lumbar puncture and diagnosis of normal pressure hydrocephalus was made.  Patient presents for ventriculoperitoneal shunting.  Past Medical History:  Diagnosis Date   Allergic rhinitis    Dust mites, mold, plant polens   Anemia 1970's   Arm fracture 1957   Arthritis    back   Depression 1991   6 months   Elevated uric acid in blood    Fibroids    Fibromyalgia 1990   Fibrosis, breast    Mammogram   GERD (gastroesophageal reflux disease)    Glucose intolerance (impaired glucose tolerance)    Grand mal seizure (HCC) 1960   Hemorrhoids, internal 10/12/2005   History of hypercalcemia    History of kidney stones    Hyperlipidemia    Hypothyroidism 1990   Osteopenia 2012   Bone density Test   Seizures (HCC)    as a child, none as an adult   Urinary tract infection     Past Surgical History:  Procedure Laterality Date   ABDOMINAL HYSTERECTOMY  1989   open surgery for severe endometriosis but did initially have laproscopic   cataracts bilat     10/24/14 10/31/2014   CYSTOSCOPY     stent removal-[facility]-Jan 16,1096   Laparotomy  1987   PCNL  10/12/2021   at [facility] for kidney stones   URETEROSCOPY     Jan 10,2017    Family History  Problem Relation Age of Onset   Parkinson's disease Father        Deceased at 34   Hypertension Mother    Osteoporosis Mother    Breast cancer Sister    Arthritis Brother    Kidney Stones Brother    Gout Brother    Pancreatic cancer Paternal Aunt    Colon cancer Neg Hx    Colon polyps Neg Hx    Social History:  reports that she has never smoked.  She has never used smokeless tobacco. She reports that she does not drink alcohol and does not use drugs.  Allergies:  Allergies  Allergen Reactions   Cefdinir Diarrhea   Sulfamethoxazole-Trimethoprim Other (See Comments)    Anxiety, jittery   Alprazolam Other (See Comments)    Get too groggy can't function   Chlorthalidone     Constipation    Ciprofloxacin     Pt gets dehydrated   Epinephrine     Makes pt feel like they are coming out of their skin   Potassium Citrate     Constipation; "CSF crash so I wasn't functioning well at all"   Metronidazole Rash    Medications Prior to Admission  Medication Sig Dispense Refill   amphetamine-dextroamphetamine (ADDERALL) 20 MG tablet Take 20 mg in the morning and 10 mg at lunch     ampicillin (PRINCIPEN) 500 MG capsule Take 500 mg by mouth at bedtime.     anti-nausea (EMETROL) solution Take 10 mLs by mouth 2 (two) times daily. (Patient taking differently: Take 10 mLs by mouth every 15 (fifteen) minutes as needed (upset stomach).) 118 mL 0   aspirin EC 325 MG tablet Take 650 mg by mouth daily as needed for moderate pain or mild pain.  Biotin 5000 MCG TABS Take 5,000 mcg by mouth daily.     Cholecalciferol (VITAMIN D3) 2000 units TABS Take 2,000 Units by mouth daily. (Patient taking differently: Take 5,000 Units by mouth daily.) 30 tablet    clonazePAM (KLONOPIN) 1 MG tablet Take .75 mg at bedtime and .25 mg in the morning (Patient taking differently: Take 0.5 mg by mouth See admin instructions. Take .75 mg at bedtime and .25 mg in the morning) 30 tablet    conjugated estrogens (PREMARIN) vaginal cream Place 1 Applicatorful vaginally 3 (three) times a week.     diphenhydrAMINE-APAP, sleep, 38-500 MG TABS Take 1 tablet by mouth at bedtime. excedrin     Evolocumab (REPATHA SURECLICK) 140 MG/ML SOAJ ADMINISTER 1 ML UNDER THE SKIN EVERY 14 DAYS 6 mL 0   fluticasone (FLONASE) 50 MCG/ACT nasal spray Place 1 spray into both nostrils daily.  2    levothyroxine (SYNTHROID) 100 MCG tablet Take 100 mcg by mouth at bedtime.     Melatonin 2.5 MG CAPS Place 2.5 mg under the tongue at bedtime.     Omega-3 Fatty Acids (FISH OIL) 1200 MG CAPS Take 2 capsules by mouth daily. 350 omega 3     OVER THE COUNTER MEDICATION Take 500 mg by mouth at bedtime. l-serine     potassium bicarbonate (K-LYTE) 25 MEQ disintegrating tablet Take 25 mEq by mouth at bedtime.     Potassium Citrate-Mag Citrate (LITHOLYTE PO) Take 10 mEq by mouth in the morning.     Probiotic Product (PROBIOTIC DAILY PO) Take 1 tablet by mouth daily with supper.     simethicone (MYLICON) 80 MG chewable tablet Chew 80 mg by mouth every 6 (six) hours as needed for flatulence (Pain).     mometasone (ELOCON) 0.1 % cream Apply 1 Application topically daily as needed (skin break-out).  2   omeprazole (PRILOSEC OTC) 20 MG tablet Take 20 mg by mouth daily as needed (heartburn).     polyethylene glycol (MIRALAX / GLYCOLAX) 17 g packet Take 17 g by mouth daily as needed for mild constipation or moderate constipation.      No results found for this or any previous visit (from the past 48 hour(s)). No results found.  Review of Systems  Neurological:  Positive for dizziness and weakness.    Blood pressure (!) 164/93, pulse 73, temperature 97.7 F (36.5 C), temperature source Oral, resp. rate 17, height 5\' 4"  (1.626 m), weight 68 kg, SpO2 99 %. Physical Exam HENT:     Head: Normocephalic.     Right Ear: Tympanic membrane normal.     Nose: Nose normal.     Mouth/Throat:     Mouth: Mucous membranes are moist.  Eyes:     Pupils: Pupils are equal, round, and reactive to light.  Cardiovascular:     Rate and Rhythm: Normal rate.     Pulses: Normal pulses.  Pulmonary:     Effort: Pulmonary effort is normal.  Abdominal:     General: Abdomen is flat.  Musculoskeletal:        General: Normal range of motion.     Cervical back: Normal range of motion.  Skin:    General: Skin is warm.   Neurological:     Mental Status: She is alert.     Comments: Patient is awake and alert pupils are equal cranial nerves are intact strength is 5 out of 5 upper and lower extremities      Assessment/Plan 72 year old presents for VP  shunt placement  Mariam Dollar, MD 06/28/2022, 1:40 PM

## 2022-06-28 NOTE — Anesthesia Procedure Notes (Signed)
Procedure Name: Intubation Date/Time: 06/28/2022 4:19 PM  Performed by: Carolynne Edouard, RNPre-anesthesia Checklist: Patient identified, Emergency Drugs available, Suction available and Patient being monitored Patient Re-evaluated:Patient Re-evaluated prior to induction Oxygen Delivery Method: Circle system utilized Preoxygenation: Pre-oxygenation with 100% oxygen Induction Type: IV induction Ventilation: Mask ventilation without difficulty Laryngoscope Size: Mac and 3 Grade View: Grade I Tube type: Oral Tube size: 7.0 mm Number of attempts: 1 Airway Equipment and Method: Stylet and Oral airway Placement Confirmation: ETT inserted through vocal cords under direct vision, positive ETCO2 and breath sounds checked- equal and bilateral Secured at: 21 cm Tube secured with: Tape Dental Injury: Teeth and Oropharynx as per pre-operative assessment

## 2022-06-28 NOTE — Op Note (Signed)
Preoperative diagnosis: Normal pressure hydrocephalus.  Postoperative diagnosis: Same.  Procedure: Right parietal ventriculoperitoneal shunt placement for normal pressure hydrocephalus.  Surgeon: Donalee Citrin.  Assist: 80 pool.  Anesthesia: General.  EBL: Minimal.  HPI: 72 year old female presented with cognitive decline incontinence and ataxia ventriculomegaly on imaging and significant improvement with high-volume lumbar puncture diagnosis of neuro no pressure to [place] was confirmed and patient was recommended ventriculoperitoneal shunt placement we extensively reviewed the risks and benefits of the operation with her as well as perioperative course expectations of outcome and alternatives of surgery and she understood and agreed to proceed forward.  Operative procedure: Patient was brought into the OR was induced under general anesthesia positioned supine the head turned to the left the right side of her head neck chest and abdomen was prepped and draped in routine sterile fashion 2 incisions were made 1 about 2 fingerbreadths posteriorly and superiorly to the top of the ear the other on the lateral rectus sheath both incisions were infiltrated and first working at the abdominal incision then incision was made I identified the external bleak fascia and the lateral rectus sheath and divided this separated the fibers of the rectus identified the transversalis fascia opened this up and 5 myself in the peritoneal cavity I used hemostats to clamp this off and packed it away and then opened up the cranial incision drilled bur hole coagulate the dura opened up the dura in a cruciate fashion and actually prior to opening up the dura I took I placed the shunt passer from the cranial incision to the abdominal incision past the strata 2 Med[employer]onic shunt assembly through the passer with the integrated strata 2 valve which had been reprogrammed to 1.0 then after this was in place I incised the dura coagulated  the cortical surface and passed an 8 cm strata 2 snap shunt ventricular catheter into the ventricle and first pass clear spinal fluid came out under pressure this was snapped in place on the valve assembly we confirmed egress of spinal fluid at the distal catheter then this was threaded and placed intraperitoneally I then used a chromic and set a pursestring closing up the peritoneum then closed at the external bleak fascia subcutaneous tissue and a running 4 subcuticular abdominal wound closed the cranial incision with active Vicryl and staples both wounds were dressed patient recovery in stable addition at the end the case all needle count sponge counts were correct.

## 2022-06-28 NOTE — Progress Notes (Signed)
eLink Physician-Brief Progress Note Patient Name: Jean Scott DOB: Feb 18, 1950 MRN: [id number]   Date of Service  06/28/2022  HPI/Events of Note  72/F with normal pressure hydrocephalus, admitted for ventriculoperitoneal shunting. She is now s/p VP shunting.   BP 174/87, HR 50s-70s, RR 11, O2 sats 91%   eICU Interventions  Pain control.  Complete perioperative antibiotics.  Hold AV nodal blocking agents as patient seems to have sick sinus syndrome on telemetry.  Get EKG.  Protonix for DVT Prophylaxis.  SCDs for DVT prophylaxis.      Intervention Category Evaluation Type: New Patient Evaluation  Larinda Buttery 06/28/2022, 8:31 PM

## 2022-06-28 NOTE — Transfer of Care (Signed)
Immediate Anesthesia Transfer of Care Note  Patient: Jean Scott  Procedure(s) Performed: Right - Parietal ventriculoperitoneal shunt placement (Right)  Patient Location: PACU  Anesthesia Type:General  Level of Consciousness: awake and drowsy  Airway & Oxygen Therapy: Patient Spontanous Breathing and Patient connected to face mask oxygen  Post-op Assessment: Report given to RN and Post -op Vital signs reviewed and stable  Post vital signs: Reviewed and stable  Last Vitals:  Vitals Value Taken Time  BP 147/86 06/28/22 1815  Temp 36 C 06/28/22 1815  Pulse 67 06/28/22 1819  Resp 13 06/28/22 1819  SpO2 96 % 06/28/22 1819  Vitals shown include unvalidated device data.  Last Pain:  Vitals:   06/28/22 1312  TempSrc:   PainSc: 5       Patients Stated Pain Goal: 0 (06/28/22 1312)  Complications: No notable events documented.

## 2022-06-29 ENCOUNTER — Encounter (HOSPITAL_COMMUNITY): Payer: Self-pay | Admitting: Neurosurgery

## 2022-06-29 MED ORDER — ORAL CARE MOUTH RINSE: 15.0000 mL | OROMUCOSAL | Status: DC | PRN

## 2022-06-29 NOTE — Anesthesia Postprocedure Evaluation (Signed)
Anesthesia Post Note  Patient: Jean Scott  Procedure(s) Performed: Right - Parietal ventriculoperitoneal shunt placement (Right)     Patient location during evaluation: PACU Anesthesia Type: General Level of consciousness: awake and alert, oriented and patient cooperative Pain management: pain level controlled Vital Signs Assessment: post-procedure vital signs reviewed and stable Respiratory status: spontaneous breathing, nonlabored ventilation and respiratory function stable Cardiovascular status: blood pressure returned to baseline and stable Postop Assessment: no apparent nausea or vomiting Anesthetic complications: no   No notable events documented.  Last Vitals:  Vitals:   06/29/22 0500 06/29/22 0600  BP: 138/75 (!) 142/74  Pulse: (!) 59 (!) 50  Resp: 20 16  Temp:    SpO2: 94% 95%    Last Pain:  Vitals:   06/29/22 0600  TempSrc:   PainSc: 6                  Lannie Fields

## 2022-06-29 NOTE — Evaluation (Signed)
Occupational Therapy Evaluation Patient Details Name: Jean Scott MRN: [id number] DOB: 1950-07-03 Today's Date: 06/29/2022   History of Present Illness 72 y.o. female admitted 06/28/22 for VP shunt for NPH.  She has PMH fibromyalgia, depression, GERD, hemorrhoids, hypothyroid, and arthritis.   Clinical Impression   Patient is s/p VP shunt surgery resulting in functional limitations due to the deficits listed below (see OT problem list). Pt at baseline with (A) from spouse for adls intermittently as needed at min (A) level.  Pt with decreased awareness of balance deficits. Patient will benefit from skilled OT acutely to increase independence and safety with ADLS to allow discharge outpatient .       Recommendations for follow up therapy are one component of a multi-disciplinary discharge planning process, led by the attending physician.  Recommendations may be updated based on patient status, additional functional criteria and insurance authorization.   Assistance Recommended at Discharge Intermittent Supervision/Assistance  Patient can return home with the following A little help with walking and/or transfers;A little help with bathing/dressing/bathroom    Functional Status Assessment  Patient has had a recent decline in their functional status and demonstrates the ability to make significant improvements in function in a reasonable and predictable amount of time.  Equipment Recommendations   (RW)    Recommendations for Other Services       Precautions / Restrictions Precautions Precautions: Fall Restrictions Weight Bearing Restrictions: No      Mobility Bed Mobility Overal bed mobility: Modified Independent             General bed mobility comments: exiting on the L side of the bed with HOb 35 degrees and returning to supine    Transfers Overall transfer level: Needs assistance Equipment used: Straight cane, 1 person hand held assist Transfers: Sit to/from  Stand Sit to Stand: Min assist                  Balance     Sitting balance-Leahy Scale: Fair       Standing balance-Leahy Scale: Poor                             ADL either performed or assessed with clinical judgement   ADL Overall ADL's : Needs assistance/impaired Eating/Feeding: Independent Eating/Feeding Details (indicate cue type and reason): drinking from cup with R hand Grooming: Wash/dry face;Modified independent Grooming Details (indicate cue type and reason): wiping mouth after drinking         Upper Body Dressing : Modified independent   Lower Body Dressing: Minimal assistance;Sit to/from stand   Toilet Transfer: Minimal assistance;Stand-pivot             General ADL Comments: pt with balance deficits noted with transfer and only requesting cane present in room. pt with hand held (A) min (A)     Vision   Additional Comments: reports sensitivity to light. recommended use of sunglasses to help pt progress out of room and to gradually increase the light in the room     Perception     Praxis      Pertinent Vitals/Pain Pain Assessment Pain Assessment: No/denies pain Pain Intervention(s): Monitored during session, Limited activity within patient's tolerance (sensitive to light and light kept very low due to photosensitivity)     Hand Dominance Right   Extremity/Trunk Assessment Upper Extremity Assessment Upper Extremity Assessment: Overall WFL for tasks assessed   Lower Extremity Assessment Lower Extremity Assessment: Defer  to PT evaluation   Cervical / Trunk Assessment Cervical / Trunk Assessment: Kyphotic   Communication Communication Communication: No difficulties   Cognition Arousal/Alertness: Awake/alert Behavior During Therapy: WFL for tasks assessed/performed Overall Cognitive Status: History of cognitive impairments - at baseline                                 General Comments: slow processing,  decreased safety awareness     General Comments  VSS on RA. no family present. pt recalls pending step down and asking if new bed available    Exercises     Shoulder Instructions      Home Living Family/patient expects to be discharged to:: Private residence Living Arrangements: Spouse/significant other Available Help at Discharge: Family Type of Home: House Home Access: Stairs to enter [employer] of Steps: 1   Home Layout: Two level;Able to live on main level with bedroom/bathroom     Bathroom Shower/Tub: Walk-in shower         Home Equipment: Gilmer Mor - single point;Shower seat          Prior Functioning/Environment Prior Level of Function : Needs assist             Mobility Comments: using cane PTA; was doing PT at [facility] for her back and may balance too ADLs Comments: spouse assists with bathing, dressing at times, pt helps with wiping counters and making beds, does not drive        OT Problem List: Impaired balance (sitting and/or standing);Decreased activity tolerance;Decreased cognition;Decreased safety awareness      OT Treatment/Interventions: Self-care/ADL training;Energy conservation;DME and/or AE instruction;Therapeutic activities;Cognitive remediation/compensation;Patient/family education;Balance training    OT Goals(Current goals can be found in the care plan section) Acute Rehab OT Goals Patient Stated Goal: to go home tomorrow OT Goal Formulation: Patient unable to participate in goal setting Time For Goal Achievement: 07/13/22 Potential to Achieve Goals: Good  OT Frequency: Min 2X/week    Co-evaluation              AM-PAC OT "6 Clicks" Daily Activity     Outcome Measure Help from another person eating meals?: A Little Help from another person taking care of personal grooming?: A Little Help from another person toileting, which includes using toliet, bedpan, or urinal?: A Little Help from another  person bathing (including washing, rinsing, drying)?: A Little Help from another person to put on and taking off regular upper body clothing?: A Little Help from another person to put on and taking off regular lower body clothing?: A Little 6 Click Score: 18   End of Session Equipment Utilized During Treatment: Gait belt Nurse Communication: Mobility status;Precautions  Activity Tolerance: Patient tolerated treatment well Patient left: in bed;with bed alarm set;with call bell/phone within reach  OT Visit Diagnosis: Unsteadiness on feet (R26.81)                Time: 8657-8469 OT Time Calculation (min): 15 min Charges:  OT General Charges $OT Visit: 1 Visit OT Evaluation $OT Eval Moderate Complexity: 1 Mod   Brynn, OTR/L  Acute Rehabilitation Services Office: [phone] .   Mateo Flow 06/29/2022, 6:55 PM

## 2022-06-29 NOTE — Evaluation (Signed)
Physical Therapy Evaluation Patient Details Name: Jean Scott MRN: [id number] DOB: [birth year] Today's Date: 06/29/2022  History of Present Illness  Jean Scott is an 72 y.o. female admitted 06/28/22 for VP shunt for NPH.  She has PMH fibromyalgia, depression, GERD, hemorrhoids, hypothyroid, and arthritis.  Clinical Impression  Patient presents with decreased mobility due to pain in back and head and decreased gait/balance/safety awareness.  Previously needing occasional assistance for ADL's and pt performing some light IADL's at home.  She used a cane or furniture walked mainly.  Currently min to mod A for ambulation in the hallway demonstrating flexed posture more with RW than with wall rail or HHA.  Short shuffling gait and somewhat stiff overall.  Hopeful for continued improvement with skilled PT in the acute setting and recommend to resume outpatient PT at d/c.        Recommendations for follow up therapy are one component of a multi-disciplinary discharge planning process, led by the attending physician.  Recommendations may be updated based on patient status, additional functional criteria and insurance authorization.  Follow Up Recommendations       Assistance Recommended at Discharge Frequent or constant Supervision/Assistance  Patient can return home with the following  A little help with walking and/or transfers;Assistance with cooking/housework;Direct supervision/assist for medications management;Assist for transportation;A little help with bathing/dressing/bathroom;Help with stairs or ramp for entrance    Equipment Recommendations Other (comment) (TBA possibly walker)  Recommendations for Other Services       Functional Status Assessment Patient has had a recent decline in their functional status and demonstrates the ability to make significant improvements in function in a reasonable and predictable amount of time.     Precautions / Restrictions Precautions Precautions:  Fall      Mobility  Bed Mobility Overal bed mobility: Needs Assistance Bed Mobility: Sit to Supine       Sit to supine: Mod assist   General bed mobility comments: assist for legs into bed    Transfers Overall transfer level: Needs assistance Equipment used: Straight cane, Rolling walker (2 wheels) Transfers: Sit to/from Stand Sit to Stand: Min assist           General transfer comment: assist for balance up from recliner and noted posterior bias even with cane so sat back down and used RW    Ambulation/Gait Ambulation/Gait assistance: Min assist, Mod assist Gait Distance (Feet): 100 Feet Assistive device: Rolling walker (2 wheels), 1 person hand held assist (wall rail) Gait Pattern/deviations: Step-to pattern, Step-through pattern, Decreased dorsiflexion - left, Decreased dorsiflexion - right, Shuffle, Trunk flexed, Drifts right/left       General Gait Details: initially with RW pt flexed very short shuffling steps and needing A to guide walker straight, in hallway used wall rail with improved upright posture and stride length, then HHA when wall rail ran out with again shorter steps and more help needed.  Stairs            Wheelchair Mobility    Modified Rankin (Stroke Patients Only)       Balance Overall balance assessment: Needs assistance   Sitting balance-Leahy Scale: Fair   Postural control: Posterior lean Standing balance support: Single extremity supported, Bilateral upper extremity supported Standing balance-Leahy Scale: Poor Standing balance comment: UE support and min A due to posterior bias esp initially  Pertinent Vitals/Pain Pain Assessment Pain Assessment: 0-10 Pain Score: 9  Pain Location: low back and surgical site Pain Descriptors / Indicators: Grimacing, Guarding, Operative site guarding Pain Intervention(s): Monitored during session, RN gave pain meds during session    Home Living  Family/patient expects to be discharged to:: Private residence Living Arrangements: Spouse/significant other Available Help at Discharge: Family Type of Home: House Home Access: Stairs to enter   Secretary/administrator of Steps: 1   Home Layout: Two level;Able to live on main level with bedroom/bathroom Home Equipment: Gilmer Mor - single point;Shower seat      Prior Function Prior Level of Function : Needs assist             Mobility Comments: using cane PTA; was doing PT at [facility] for her back and may balance too ADLs Comments: spouse assists with bathing, dressing at times, pt helps with wiping counters and making beds, does not drive     Hand Dominance        Extremity/Trunk Assessment   Upper Extremity Assessment Upper Extremity Assessment: Defer to OT evaluation    Lower Extremity Assessment Lower Extremity Assessment: Generalized weakness    Cervical / Trunk Assessment Cervical / Trunk Assessment: Kyphotic  Communication   Communication: No difficulties  Cognition Arousal/Alertness: Awake/alert Behavior During Therapy: WFL for tasks assessed/performed Overall Cognitive Status: History of cognitive impairments - at baseline                                 General Comments: slow processing, decreased safety awareness        General Comments General comments (skin integrity, edema, etc.): spouse in the room initially, VSS throughout on RA    Exercises     Assessment/Plan    PT Assessment Patient needs continued PT services  PT Problem List Decreased balance;Pain;Decreased mobility;Decreased knowledge of use of DME;Decreased activity tolerance       PT Treatment Interventions DME instruction;Functional mobility training;Balance training;Patient/family education;Gait training;Therapeutic activities;Neuromuscular re-education;Therapeutic exercise;Cognitive remediation    PT Goals (Current goals can be found in the Care  Plan section)  Acute Rehab PT Goals Patient Stated Goal: to return to independent PT Goal Formulation: With patient/family Time For Goal Achievement: 07/13/22 Potential to Achieve Goals: Good    Frequency Min 4X/week     Co-evaluation               AM-PAC PT "6 Clicks" Mobility  Outcome Measure Help needed turning from your back to your side while in a flat bed without using bedrails?: A Lot Help needed moving from lying on your back to sitting on the side of a flat bed without using bedrails?: A Lot Help needed moving to and from a bed to a chair (including a wheelchair)?: A Little Help needed standing up from a chair using your arms (e.g., wheelchair or bedside chair)?: A Lot Help needed to walk in hospital room?: A Lot Help needed climbing 3-5 steps with a railing? : Total 6 Click Score: 12    End of Session Equipment Utilized During Treatment: Gait belt Activity Tolerance: Patient limited by fatigue;Patient limited by pain Patient left: in bed;with call bell/phone within reach   PT Visit Diagnosis: Other abnormalities of gait and mobility (R26.89);Other symptoms and signs involving the nervous system (R29.898)    Time: 7846-9629 PT Time Calculation (min) (ACUTE ONLY): 26 min   Charges:   PT Evaluation $PT Eval  Moderate Complexity: 1 Mod PT Treatments $Gait Training: 8-22 mins        Sheran Lawless, PT Acute Rehabilitation Services Office:[phone] 06/29/2022   Elray Mcgregor 06/29/2022, 4:00 PM

## 2022-06-29 NOTE — Progress Notes (Signed)
Subjective: Patient reports patient with condition of incisional pain otherwise doing okay  Objective: Vital signs in last 24 hours: Temp:  [96.8 F (36 C)-97.7 F (36.5 C)] 97.5 F (36.4 C) (06/25 0400) Pulse Rate:  [50-79] 70 (06/25 0800) Resp:  [9-21] 11 (06/25 0800) BP: (134-178)/(71-109) 143/97 (06/25 0800) SpO2:  [92 %-100 %] 94 % (06/25 0800) Weight:  [68 kg] 68 kg (06/24 1253)  Intake/Output from previous day: 06/24 0701 - 06/25 0700 In: 750 [I.V.:700; IV Piggyback:50] Out: 800 [Urine:700; Blood:100] Intake/Output this shift: No intake/output data recorded.  Awake alert currently sitting on the bedside commode incisions mild amount of saturation but otherwise fine  Lab Results: Recent Labs    06/28/22 1335  WBC 5.1  HGB 13.3  HCT 45.3  PLT 220   BMET No results for input(s): "NA", "K", "CL", "CO2", "GLUCOSE", "BUN", "CREATININE", "CALCIUM" in the last 72 hours.  Studies/Results: CT HEAD WO CONTRAST ( )  Result Date: 06/28/2022 CLINICAL DATA:  Hydrocephalus. EXAM: CT HEAD WITHOUT CONTRAST TECHNIQUE: Contiguous axial images were obtained from the base of the skull through the vertex without intravenous contrast. RADIATION DOSE REDUCTION: This exam was performed according to the departmental dose-optimization program which includes automated exposure control, adjustment of the mA and/or kV according to patient size and/or use of iterative reconstruction technique. COMPARISON:  Head CT 07/31/2021. FINDINGS: Brain: There is new demonstration of a right parieto-occipital approach ventriculostomy catheter with the tip crossing the midline in the paramedian posterior body of the left lateral ventricle. There are skin staples in the overlying scalp and scattered air pockets in the scalp around the external component. There is trace pneumocephalus in the underlying right occipitotemporal epidural space. There is a small intraventricular air droplet anteriorly in the right  frontal horn. No other intraventricular air is seen and no underlying extra-axial or parenchymal bleed. The ventricles are stable in size with moderate generalized ventriculomegaly, relatively mild cerebral cortical atrophy and moderately advanced small vessel disease of the cerebral white matter. No acute cortical based infarct, midline shift or mass effect are seen. The brainstem and cerebellum are unremarkable. The third and fourth ventricles are patent. Vascular: No hyperdense vessel or unexpected calcification. Skull: No skull fracture or focal lesions. Sinuses/Orbits: There is membrane thickening and mild complex debris in the right maxillary sinus, membrane thickening in the left maxillary sinus. The other paranasal sinuses and bilateral mastoid air cells are clear. Nasal septum is midline. Old lens replacements are again noted with otherwise negative orbits. Other: None. IMPRESSION: 1. Interval placement of a right parieto-occipital approach ventriculostomy catheter with the tip crossing the midline in the posterior body of the left lateral ventricle. 2. Stable ventriculomegaly. 3. Small amount of intraventricular air in the right frontal horn. Minimal epidural pneumocephalus right occipitotemporal area. No appreciable intracranial bleed. 4. Right greater than left maxillary sinus membrane thickening with complex debris in the right maxillary sinus. Electronically Signed   By: Almira Bar M.D.   On: 06/28/2022 22:54    Assessment/Plan: Postop day 1 VP shunt placement mobilize with physical Occupational Therapy transfer to floor  LOS: 1 day     Mariam Dollar 06/29/2022, 8:18 AM

## 2022-06-30 LAB — GLUCOSE, CAPILLARY: Glucose-Capillary: 85 mg/dL (ref 70–99)

## 2022-06-30 MED ORDER — HYDROCODONE-ACETAMINOPHEN 5-325 MG PO TABS: 2.0000 | ORAL_TABLET | ORAL | 0 refills | Status: DC | PRN

## 2022-06-30 NOTE — Progress Notes (Signed)
Occupational Therapy Treatment Patient Details Name: SHALAYNE LEACH MRN: [id number] DOB: [birth year] Today's Date: 06/30/2022   History of present illness 72 y.o. female admitted 06/28/22 for VP shunt for NPH.  She has PMH fibromyalgia, depression, GERD, hemorrhoids, hypothyroid, and arthritis.   OT comments  Pt agreeable to participate in OT session with some encouragement from husband. Pt currently waiting to be discharge home today with husband providing necessary physical and cognitive assist. Session focused on dynamic standing balance, functional mobility, safety awareness, and patient/family education regarding progressing activity/ADL tasks at home. Pt demonstrated improved dynamic standing balance while participating in deck of card task. VC provided during functional mobility while navigating within hallway regarding standing posture and increasing length of steps. Pt and husband educated on taking short walks together outside or in the home to progress activity tolerance. Attempt baking cookies by collecting all ingredients and bring to table in order to sit while preparing. To assist with memory of recipe, have pt write it down first then reference recipe if she forgets. Pt verbalized understanding. OP OT follow up is still recommended.    Recommendations for follow up therapy are one component of a multi-disciplinary discharge planning process, led by the attending physician.  Recommendations may be updated based on patient status, additional functional criteria and insurance authorization.    Assistance Recommended at Discharge Intermittent Supervision/Assistance  Patient can return home with the following  A little help with walking and/or transfers;A little help with bathing/dressing/bathroom;Assistance with cooking/housework;Assist for transportation   Equipment Recommendations  None recommended by OT       Precautions / Restrictions Precautions Precautions:  Fall Restrictions Weight Bearing Restrictions: No       Mobility Bed Mobility Overal bed mobility: Modified Independent    Patient Response: Cooperative  Transfers Overall transfer level: Needs assistance Equipment used: Straight cane Transfers: Sit to/from Stand, Bed to chair/wheelchair/BSC Sit to Stand: Supervision     Step pivot transfers: Supervision           Balance Overall balance assessment: Needs assistance Sitting-balance support: No upper extremity supported, Feet supported Sitting balance-Leahy Scale: Good Sitting balance - Comments: sitting EOB to don slip on shoes   Standing balance support: Single extremity supported, During functional activity, Reliant on assistive device for balance Standing balance-Leahy Scale: Fair Standing balance comment: Able to maintain balance without external support when within usual base of support. When challenged with dynamic standing activity, pt utilized SPC in either right or left hand to assist to maintaining balance while reaching outside usual base of support. OT provided close supervision during balance activity utilizing deck of cards although no physical assist needed to maintain balance.        ADL either performed or assessed with clinical judgement      Cognition Arousal/Alertness: Awake/alert Behavior During Therapy: WFL for tasks assessed/performed Overall Cognitive Status: History of cognitive impairments - at baseline        General Comments: slow processing, decreased safety awareness              General Comments Husband, Chip in room.    Pertinent Vitals/ Pain       Pain Assessment Pain Assessment: No/denies pain Pain Score: 0-No pain         Frequency  Min 2X/week        Progress Toward Goals  OT Goals(current goals can now be found in the care plan section)  Progress towards OT goals: Progressing toward goals (3/4 therapy goals met this  session)  ADL Goals Pt Will Perform  Lower Body Dressing:  (goal met 6/26) Pt Will Transfer to Toilet:  (goal met 6/26) Additional ADL Goal #1:  (goal met 6/26)  Plan Discharge plan remains appropriate       AM-PAC OT "6 Clicks" Daily Activity     Outcome Measure   Help from another person eating meals?: A Little Help from another person taking care of personal grooming?: A Little Help from another person toileting, which includes using toliet, bedpan, or urinal?: A Little Help from another person bathing (including washing, rinsing, drying)?: A Little Help from another person to put on and taking off regular upper body clothing?: A Little Help from another person to put on and taking off regular lower body clothing?: A Little 6 Click Score: 18    End of Session Equipment Utilized During Treatment: Gait belt;Other (comment) (SPC)  OT Visit Diagnosis: Unsteadiness on feet (R26.81)   Activity Tolerance Patient tolerated treatment well   Patient Left in bed;with call bell/phone within reach;with family/visitor present;Other (comment) (seated on EOB)           Time: 1610-9604 OT Time Calculation (min): 21 min  Charges: OT General Charges $OT Visit: 1 Visit OT Treatments $Therapeutic Activity: 8-22 mins Limmie Patricia, OTR/L,CBIS  Supplemental OT - [facility] and [facility] Secure Chat Preferred    Latissa Frick, Charisse March 06/30/2022, 12:24 PM

## 2022-06-30 NOTE — Discharge Summary (Signed)
Physician Discharge Summary  Patient ID: Jean Scott MRN: [id number] DOB/AGE: 1950/06/17 72 y.o. Estimated body mass index is 25.75 kg/m as calculated from the following:   Height as of this encounter: 5\' 4"  (1.626 m).   Weight as of this encounter: 68 kg.   Admit date: 06/28/2022 Discharge date: 06/30/2022  Admission Diagnoses: Normal pressure hydrocephalus  Discharge Diagnoses: Same Principal Problem:   NPH (normal pressure hydrocephalus) [facility])   Discharged Condition: good  Hospital Course: Patient admitted to hospital underwent ventriculoperitoneal shunt placement postoperative patient did very well recovered in the floor on the floor was ambulating and voiding tolerating regular diet stable for discharge home with the care of her husband.  Patient will be discharged with scheduled follow-up in 2 weeks.  Consults: Significant Diagnostic Studies: Treatments: Ventriculoperitoneal shunt placement Discharge Exam: Blood pressure (!) 157/83, pulse 65, temperature (!) 97.5 F (36.4 C), temperature source Oral, resp. rate 14, height 5\' 4"  (1.626 m), weight 68 kg, SpO2 99 %. Awake and alert incisions clean dry and intact  Disposition: Home   Allergies as of 06/30/2022       Reactions   Cefdinir Diarrhea   Sulfamethoxazole-trimethoprim Other (See Comments)   Anxiety, jittery   Alprazolam Other (See Comments)   Get too groggy can't function   Chlorthalidone    Constipation   Ciprofloxacin    Pt gets dehydrated   Epinephrine    Makes pt feel like they are coming out of their skin   Potassium Citrate    Constipation; "CSF crash so I wasn't functioning well at all"   Metronidazole Rash        Medication List     TAKE these medications    amphetamine-dextroamphetamine 20 MG tablet Commonly known as: Adderall Take 20 mg in the morning and 10 mg at lunch   ampicillin 500 MG capsule Commonly known as: PRINCIPEN Take 500 mg by mouth at bedtime.   anti-nausea  solution Take 10 mLs by mouth 2 (two) times daily. What changed:  when to take this reasons to take this   aspirin EC 325 MG tablet Take 650 mg by mouth daily as needed for moderate pain or mild pain.   Biotin 5000 MCG Tabs Take 5,000 mcg by mouth daily.   clonazePAM 1 MG tablet Commonly known as: KLONOPIN Take .75 mg at bedtime and .25 mg in the morning What changed:  how much to take how to take this when to take this   conjugated estrogens 0.625 MG/GM vaginal cream Commonly known as: PREMARIN Place 1 Applicatorful vaginally 3 (three) times a week.   diphenhydrAMINE-APAP (sleep) 38-500 MG Tabs Take 1 tablet by mouth at bedtime. excedrin   Fish Oil 1200 MG Caps Take 2 capsules by mouth daily. 350 omega 3   fluticasone 50 MCG/ACT nasal spray Commonly known as: Flonase Place 1 spray into both nostrils daily.   HYDROcodone-acetaminophen 5-325 MG tablet Commonly known as: NORCO/VICODIN Take 2 tablets by mouth every 4 (four) hours as needed for severe pain ((score 7 to 10)).   levothyroxine 100 MCG tablet Commonly known as: SYNTHROID Take 100 mcg by mouth at bedtime.   LITHOLYTE PO Take 10 mEq by mouth in the morning.   Melatonin 2.5 MG Caps Place 2.5 mg under the tongue at bedtime.   mometasone 0.1 % cream Commonly known as: ELOCON Apply 1 Application topically daily as needed (skin break-out).   omeprazole 20 MG tablet Commonly known as: PRILOSEC OTC Take 20 mg by mouth  daily as needed (heartburn).   OVER THE COUNTER MEDICATION Take 500 mg by mouth at bedtime. l-serine   polyethylene glycol 17 g packet Commonly known as: MIRALAX / GLYCOLAX Take 17 g by mouth daily as needed for mild constipation or moderate constipation.   potassium bicarbonate 25 MEQ disintegrating tablet Commonly known as: K-LYTE Take 25 mEq by mouth at bedtime.   PROBIOTIC DAILY PO Take 1 tablet by mouth daily with supper.   Repatha SureClick 140 MG/ML Soaj Generic drug:  Evolocumab ADMINISTER 1 ML UNDER THE SKIN EVERY 14 DAYS   simethicone 80 MG chewable tablet Commonly known as: MYLICON Chew 80 mg by mouth every 6 (six) hours as needed for flatulence (Pain).   Vitamin D3 50 MCG (2000 UT) Tabs Take 2,000 Units by mouth daily. What changed: how much to take         Signed: Mariam Dollar 06/30/2022, 10:36 AM

## 2022-06-30 NOTE — Progress Notes (Signed)
Physical Therapy Treatment Patient Details Name: Jean Scott MRN: [id number] DOB: 1950-09-18 Today's Date: 06/30/2022   History of Present Illness 72 y.o. female admitted 06/28/22 for VP shunt for NPH.  She has PMH fibromyalgia, depression, GERD, hemorrhoids, hypothyroid, and arthritis.    PT Comments    Patient progressing some with balance and gait and upright posture.  Needing cues throughout for posture, stride length and using audio feedback for stepping and eventually weight shift facilitation.  Patient working on sit to stand with cues and demo for improved anterior weight shift and keeping COG over BOS.  She has potential for continued improvement with follow up outpatient PT.  Note for home today.  No current DME needs.    Recommendations for follow up therapy are one component of a multi-disciplinary discharge planning process, led by the attending physician.  Recommendations may be updated based on patient status, additional functional criteria and insurance authorization.  Follow Up Recommendations       Assistance Recommended at Discharge Frequent or constant Supervision/Assistance  Patient can return home with the following A little help with walking and/or transfers;Assistance with cooking/housework;Direct supervision/assist for medications management;Assist for transportation;A little help with bathing/dressing/bathroom;Help with stairs or ramp for entrance   Equipment Recommendations  None recommended by PT    Recommendations for Other Services       Precautions / Restrictions Precautions Precautions: Fall Restrictions Weight Bearing Restrictions: No     Mobility  Bed Mobility   Bed Mobility: Supine to Sit     Supine to sit: Supervision, HOB elevated Sit to supine: Mod assist   General bed mobility comments: to sit with cues for rails and increased time, to supine cues for technique to sidelying and assist for legs    Transfers Overall transfer level:  Needs assistance Equipment used: Straight cane Transfers: Sit to/from Stand Sit to Stand: Min assist           General transfer comment: assist for sit to stand multiple times for LE strength, balance and anterior weight shift with min A for posterior bias    Ambulation/Gait Ambulation/Gait assistance: Min assist, Min guard Gait Distance (Feet): 150 Feet Assistive device: Straight cane (wall rail at times) Gait Pattern/deviations: Step-to pattern, Step-through pattern, Shuffle, Decreased stride length, Decreased dorsiflexion - left, Decreased dorsiflexion - right       General Gait Details: cues for stride length, attempted using steady beat for increased consistency with stepping, varying from hand tap to "right-left" or "step-step" using cane mostly then at times rail and cane   Stairs             Wheelchair Mobility    Modified Rankin (Stroke Patients Only)       Balance Overall balance assessment: Needs assistance   Sitting balance-Leahy Scale: Good     Standing balance support: Single extremity supported, During functional activity, Reliant on assistive device for balance Standing balance-Leahy Scale: Poor Standing balance comment: initial standing with posterior bias; needing cane for support for dynamic balance,  Can stand statically with S once moving away from EOB                            Cognition Arousal/Alertness: Awake/alert Behavior During Therapy: WFL for tasks assessed/performed Overall Cognitive Status: History of cognitive impairments - at baseline  General Comments: slow processing, decreased safety awareness        Exercises Other Exercises Other Exercises: sit<>stand x 6 cues and demo for wide BOS, anterior weight shift, keeping weight over feet during transition up and back down with min A due to posterior bias.    General Comments General comments (skin integrity, edema,  etc.): Spouse in the room, discussed PT follow up.      Pertinent Vitals/Pain Pain Assessment Pain Assessment: Faces Faces Pain Scale: Hurts little more Pain Location: low back and surgical site Pain Descriptors / Indicators: Discomfort, Sore Pain Intervention(s): Monitored during session    Home Living                          Prior Function            PT Goals (current goals can now be found in the care plan section) Progress towards PT goals: Progressing toward goals    Frequency    Min 4X/week      PT Plan Current plan remains appropriate    Co-evaluation              AM-PAC PT "6 Clicks" Mobility   Outcome Measure  Help needed turning from your back to your side while in a flat bed without using bedrails?: A Little Help needed moving from lying on your back to sitting on the side of a flat bed without using bedrails?: A Little Help needed moving to and from a bed to a chair (including a wheelchair)?: A Little Help needed standing up from a chair using your arms (e.g., wheelchair or bedside chair)?: A Little Help needed to walk in hospital room?: A Little Help needed climbing 3-5 steps with a railing? : Total 6 Click Score: 16    End of Session Equipment Utilized During Treatment: Gait belt Activity Tolerance: Patient tolerated treatment well Patient left: in bed;with call bell/phone within reach;with family/visitor present   PT Visit Diagnosis: Other abnormalities of gait and mobility (R26.89);Other symptoms and signs involving the nervous system (R29.898)     Time: 7829-5621 PT Time Calculation (min) (ACUTE ONLY): 24 min  Charges:  $Gait Training: 8-22 mins $Therapeutic Activity: 8-22 mins                     Sheran Lawless, PT Acute Rehabilitation Services Office:[phone] 06/30/2022    Elray Mcgregor 06/30/2022, 1:58 PM

## 2022-06-30 NOTE — TOC Transition Note (Addendum)
Transition of Care (TOC) - CM/SW Discharge Note Donn Pierini RN,BSN Transitions of Care Unit 4NP (Non Trauma)- RN Case Manager See Treatment Team for direct Phone #   Patient Details  Name: KAIANNA DOLEZAL MRN: [id number] Date of Birth: [birth year]  Transition of Care [facility]) CM/SW Contact:  Darrold Span, RN Phone Number: 06/30/2022, 12:31 PM   Clinical Narrative:    Pt stable for transition home today, No TOC needs noted- no recommendations per PT/OT evals.  Has transportation home  1415- post discharge received msg from PT regarding updated recommendation for outpt PT/OT- follow up done and referral made to [facility] street location.      Final next level of care: Home/Self Care Barriers to Discharge: No Barriers Identified   Patient Goals and CMS Choice   Choice offered to / list presented to : NA  Discharge Placement                 Home        Discharge Plan and Services Additional resources added to the After Visit Summary for                  DME Arranged: N/A DME Agency: NA       HH Arranged: NA HH Agency: NA        Social Determinants of Health (SDOH) Interventions SDOH Screenings   Tobacco Use: Low Risk  (06/29/2022)     Readmission Risk Interventions    06/30/2022   12:30 PM  Readmission Risk Prevention Plan  Post Dischage Appt Complete  Medication Screening Complete  Transportation Screening Complete

## 2022-06-30 NOTE — Plan of Care (Signed)
  Problem: Pain Management: Goal: Pain level will decrease Outcome: Progressing   Problem: Skin Integrity: Goal: Will show signs of wound healing Outcome: Progressing   

## 2022-06-30 NOTE — Progress Notes (Signed)
AVS given and reviewed with pt and husband at bedside. Medications discussed. All questions answered to satisfaction. Pt verbalized understanding of information given. Pt escorted off the unit with all belongings via wheelchair by staff member.

## 2022-07-12 DIAGNOSIS — N200 Calculus of kidney: Secondary | ICD-10-CM | POA: Diagnosis not present

## 2022-07-14 ENCOUNTER — Ambulatory Visit: Payer: Medicare Other | Admitting: Physical Therapy

## 2022-07-16 ENCOUNTER — Encounter: Payer: Self-pay | Admitting: Neurosurgery

## 2022-07-19 ENCOUNTER — Encounter: Payer: Self-pay | Admitting: Neurology

## 2022-07-19 ENCOUNTER — Ambulatory Visit: Payer: Medicare Other | Admitting: Neurology

## 2022-07-19 VITALS — BP 168/81 | HR 92 | Ht 64.0 in | Wt 149.0 lb

## 2022-07-19 DIAGNOSIS — G912 (Idiopathic) normal pressure hydrocephalus: Secondary | ICD-10-CM

## 2022-07-19 NOTE — Patient Instructions (Signed)
It was great to see you today.  I am pleased to see that you are doing much better after your shunt.

## 2022-07-19 NOTE — Progress Notes (Signed)
Follow-up Visit   Date: 07/19/2022    Jean Scott MRN: [id number] DOB: 09-11-1950    Jean Scott is a 72 y.o. right-handed Caucasian female with hypothyroidism, hyperlipidemia, chronic fatigue syndrome  returning to the clinic for follow-up of unsteady gait.  The patient was accompanied to the clinic by husband who also provides collateral information.    IMPRESSION/PLAN: Normal pressure hydrocephalus s/p VP shunt (June 2024 by Dr. Wynetta Emery) with improvement in gait.  She longer has urinary urgency.  She has some memory issue which has also improved, she feels that residual symptoms are due to chronic fatigue syndrome.  She will continue to follow-up with Dr. Wynetta Emery.  2.  Multilevel lumbosacral degenerative spondylosis, asymptomatic.    Return to clinic as needed  --------------------------------------------- History of present illness: Starting around 2021, she began having unsteadiness which is worse with prolonged walking.  She has fallen about 4 times over the past year.  She walks unassisted.  Even at rest, she feels unsteady. She endorses low back pain and bilateral leg pain, described as needle pricks, which is worse in the morning.  She takes Advil and applies Biofreeze, which help a little.      She has lost control of urine and bowel about 3 times.    She has difficulty with memory and has noticed difficulty with mathematics, despite being a math major. Husband does grocery shopping, cleaning, and cooking.  She manages finances and drives.  This started in 1991 when she was diagnosed with chronic fatigue syndrome.    She had CT head performed on 07/31/2021 because of confusion and unsteadiness which shows findings concerning for normal pressure hydrocephalus and was referred for further evaluation.    History of seizures as a child previously treated with dilantin.  Off antiepileptic medication for > 40 years.   UPDATE 11/09/2021:  Since October, she has been dealing with  a number of medical conditions, including kidney stone, UTI, and C.difficile.  She continues to have bilateral leg pain and takes Advil daily.  She did not tolerate gabapentin due to cognitive side effects, so stopped it.  No new weakness or falls.  She had to stop PT due to other medical conditions.  Her urinary incontinence has improved since having kidney stone addressed.  No new changes with memory.  No interval falls.   UPDATE 03/15/2022:  She reports having no significant change in pain with Cymbalta 30mg .  She denies tingling, burning, or stabbing pain.  She complain of stiffness and achy pain in the hips, legs, and back.  She takes aspirin 325mg  twice daily, which alleviates her joint pain.  She also reports mild memory changes with repeating the same questions.  Last month, she has a few spells of urinary urgency, where she was unable to get to the bathroom in time, but denies incontinence.    UPDATE 07/19/2022:  She underwent VP shunt for NPH in June by Dr. Wynetta Emery.  She noticed significant improvement in her balance and gait.  She is no longer using a cane.  She denies urinary urgency.  Memory has also improved about 80%.  Husband says that her chronic fatigue syndrome has good days and bad days, which also affects her memory, but overall doing well.  She continues to have significant joint pain throughout.  No new complaints.   Medications:  Current Outpatient Medications on File Prior to Visit  Medication Sig Dispense Refill   amphetamine-dextroamphetamine (ADDERALL) 20 MG tablet Take 20 mg  in the morning and 10 mg at lunch     ampicillin (PRINCIPEN) 500 MG capsule Take 500 mg by mouth at bedtime.     anti-nausea (EMETROL) solution Take 10 mLs by mouth 2 (two) times daily. (Patient taking differently: Take 10 mLs by mouth every 15 (fifteen) minutes as needed (upset stomach).) 118 mL 0   aspirin EC 325 MG tablet Take 650 mg by mouth daily as needed for moderate pain or mild pain.     bacitracin  ointment Apply 1 Application topically 2 (two) times daily.     Biotin 5000 MCG TABS Take 5,000 mcg by mouth daily.     Cholecalciferol (VITAMIN D3) 2000 units TABS Take 2,000 Units by mouth daily. (Patient taking differently: Take 5,000 Units by mouth daily.) 30 tablet    clonazePAM (KLONOPIN) 1 MG tablet Take .75 mg at bedtime and .25 mg in the morning (Patient taking differently: Take 0.5 mg by mouth See admin instructions. Take .75 mg at bedtime and .25 mg in the morning) 30 tablet    conjugated estrogens (PREMARIN) vaginal cream Place 1 Applicatorful vaginally 3 (three) times a week.     diphenhydrAMINE-APAP, sleep, 38-500 MG TABS Take 1 tablet by mouth at bedtime. excedrin     Evolocumab (REPATHA SURECLICK) 140 MG/ML SOAJ ADMINISTER 1 ML UNDER THE SKIN EVERY 14 DAYS 6 mL 0   fluticasone (FLONASE) 50 MCG/ACT nasal spray Place 1 spray into both nostrils daily.  2   HYDROcodone-acetaminophen (NORCO/VICODIN) 5-325 MG tablet Take 2 tablets by mouth every 4 (four) hours as needed for severe pain ((score 7 to 10)). 30 tablet 0   levothyroxine (SYNTHROID) 100 MCG tablet Take 100 mcg by mouth at bedtime.     Melatonin 2.5 MG CAPS Place 2.5 mg under the tongue at bedtime.     mometasone (ELOCON) 0.1 % cream Apply 1 Application topically daily as needed (skin break-out).  2   Omega-3 Fatty Acids (FISH OIL) 1200 MG CAPS Take 2 capsules by mouth daily. 350 omega 3     omeprazole (PRILOSEC OTC) 20 MG tablet Take 20 mg by mouth daily as needed (heartburn).     OVER THE COUNTER MEDICATION Take 500 mg by mouth at bedtime. l-serine     polyethylene glycol (MIRALAX / GLYCOLAX) 17 g packet Take 17 g by mouth daily as needed for mild constipation or moderate constipation.     potassium bicarbonate (K-LYTE) 25 MEQ disintegrating tablet Take 25 mEq by mouth at bedtime.     Potassium Citrate-Mag Citrate (LITHOLYTE PO) Take 10 mEq by mouth in the morning.     Probiotic Product (PROBIOTIC DAILY PO) Take 1 tablet by  mouth daily with supper.     simethicone (MYLICON) 80 MG chewable tablet Chew 80 mg by mouth every 6 (six) hours as needed for flatulence (Pain).     No current facility-administered medications on file prior to visit.    Allergies:  Allergies  Allergen Reactions   Cefdinir Diarrhea   Sulfamethoxazole-Trimethoprim Other (See Comments)    Anxiety, jittery   Alprazolam Other (See Comments)    Get too groggy can't function   Chlorthalidone     Constipation    Ciprofloxacin     Pt gets dehydrated   Epinephrine     Makes pt feel like they are coming out of their skin   Potassium Citrate     Constipation; "CSF crash so I wasn't functioning well at all"   Metronidazole Rash  Vital Signs:  BP (!) 168/81   Pulse 92   Ht 5\' 4"  (1.626 m)   Wt 149 lb (67.6 kg)   SpO2 98%   BMI 25.58 kg/m    Neurological Exam: MENTAL STATUS including orientation to time, place, person, recent and remote memory, attention span and concentration, language, and fund of knowledge is fairly intact.  Speech is not dysarthric.  CRANIAL NERVES:  Pupils equal round and reactive to light.  Normal conjugate, extra-ocular eye movements in all directions of gaze.  No ptosis.  Face is symmetric.   MOTOR:  Motor strength is 5/5 in all extremities.  No atrophy, fasciculations or abnormal movements.  No pronator drift.  Tone is normal.    MSRs:  Reflexes are 2+/4 throughout.  SENSORY:  Intact to vibration throughout.  COORDINATION/GAIT:  Normal finger-to- nose-finger.  Intact rapid alternating movements bilaterally.  Gait is mildly wide-based, unassisted, stable.   Data: MRI brain wo contrast 08/26/2021: Chronic small-vessel ischemic changes affecting the pons and affecting the cerebral hemispheric white matter to a severe degree. Enlargement of the ventricular system, favored to be due to central atrophy and ex vacuo enlargement. However, normal pressure hydrocephalus is possible based on the imaging.    Maxillary sinusitis with mucosal thickening and layering fluid.  MRI lumbar spine 08/26/2021: L3-4: Asymmetric disc bulge more prominent towards the right with right foraminal encroachment that could possibly affect the right L3 nerve.   L4-5: Facet degeneration and hypertrophy with 2 mm of anterolisthesis. Bulging of the disc. Mild multifactorial stenosis with crowding of the nerve roots but no visible neural compression.   L5-S1: Advanced bilateral facet arthropathy with 4 mm of anterolisthesis, which could worsen with standing or flexion. Endplate osteophytes and bulging of the disc. Multifactorial spinal stenosis of the canal, lateral recesses and neural foramina that could cause neural compression on either or both sides. This appearance would likely worsen with standing or flexion. Discogenic endplate marrow changes of the endplates are likely associated with regional back pain.      Thank you for allowing me to participate in patient's care.  If I can answer any additional questions, I would be pleased to do so.    Sincerely,    Chaitanya Amedee K. Allena Katz, DO

## 2022-07-20 ENCOUNTER — Ambulatory Visit: Payer: Medicare Other | Admitting: Occupational Therapy

## 2022-07-27 NOTE — Telephone Encounter (Signed)
Na

## 2022-08-10 ENCOUNTER — Other Ambulatory Visit: Payer: Self-pay | Admitting: Neurosurgery

## 2022-08-10 DIAGNOSIS — G912 (Idiopathic) normal pressure hydrocephalus: Secondary | ICD-10-CM

## 2022-08-12 ENCOUNTER — Other Ambulatory Visit: Payer: Self-pay | Admitting: Cardiovascular Disease

## 2022-08-12 DIAGNOSIS — I712 Thoracic aortic aneurysm, without rupture, unspecified: Secondary | ICD-10-CM

## 2022-08-12 DIAGNOSIS — E782 Mixed hyperlipidemia: Secondary | ICD-10-CM

## 2022-08-18 ENCOUNTER — Ambulatory Visit
Admission: RE | Admit: 2022-08-18 | Discharge: 2022-08-18 | Disposition: A | Discharge status: Home / Self Care | Payer: Medicare Other | Source: Ambulatory Visit | Attending: Neurosurgery | Admitting: Neurosurgery

## 2022-08-18 DIAGNOSIS — G912 (Idiopathic) normal pressure hydrocephalus: Secondary | ICD-10-CM | POA: Diagnosis not present

## 2022-08-18 DIAGNOSIS — Z982 Presence of cerebrospinal fluid drainage device: Secondary | ICD-10-CM | POA: Diagnosis not present

## 2022-09-01 DIAGNOSIS — N3941 Urge incontinence: Secondary | ICD-10-CM | POA: Diagnosis not present

## 2022-09-01 DIAGNOSIS — N202 Calculus of kidney with calculus of ureter: Secondary | ICD-10-CM | POA: Diagnosis not present

## 2022-09-01 DIAGNOSIS — N3020 Other chronic cystitis without hematuria: Secondary | ICD-10-CM | POA: Diagnosis not present

## 2022-09-10 ENCOUNTER — Encounter: Payer: Self-pay | Admitting: Cardiovascular Disease

## 2022-10-11 DIAGNOSIS — R82991 Hypocitraturia: Secondary | ICD-10-CM | POA: Diagnosis not present

## 2022-10-11 DIAGNOSIS — Q631 Lobulated, fused and horseshoe kidney: Secondary | ICD-10-CM | POA: Diagnosis not present

## 2022-10-11 DIAGNOSIS — N200 Calculus of kidney: Secondary | ICD-10-CM | POA: Diagnosis not present

## 2022-10-25 ENCOUNTER — Encounter: Payer: Self-pay | Admitting: Cardiovascular Disease

## 2022-10-25 NOTE — Telephone Encounter (Signed)
Forwarded message to Jean Scott, [facility]

## 2022-10-26 DIAGNOSIS — G912 (Idiopathic) normal pressure hydrocephalus: Secondary | ICD-10-CM | POA: Diagnosis not present

## 2022-11-30 DIAGNOSIS — E785 Hyperlipidemia, unspecified: Secondary | ICD-10-CM | POA: Diagnosis not present

## 2022-11-30 DIAGNOSIS — I10 Essential (primary) hypertension: Secondary | ICD-10-CM | POA: Diagnosis not present

## 2022-11-30 DIAGNOSIS — R7301 Impaired fasting glucose: Secondary | ICD-10-CM | POA: Diagnosis not present

## 2022-11-30 DIAGNOSIS — E039 Hypothyroidism, unspecified: Secondary | ICD-10-CM | POA: Diagnosis not present

## 2022-12-04 ENCOUNTER — Other Ambulatory Visit: Payer: Self-pay | Admitting: Cardiovascular Disease

## 2022-12-04 DIAGNOSIS — E782 Mixed hyperlipidemia: Secondary | ICD-10-CM

## 2022-12-04 DIAGNOSIS — I712 Thoracic aortic aneurysm, without rupture, unspecified: Secondary | ICD-10-CM

## 2022-12-05 DIAGNOSIS — Z1212 Encounter for screening for malignant neoplasm of rectum: Secondary | ICD-10-CM | POA: Diagnosis not present

## 2022-12-07 DIAGNOSIS — Z1331 Encounter for screening for depression: Secondary | ICD-10-CM | POA: Diagnosis not present

## 2022-12-07 DIAGNOSIS — Z0000 Encounter for general adult medical examination without abnormal findings: Secondary | ICD-10-CM | POA: Diagnosis not present

## 2022-12-07 DIAGNOSIS — I119 Hypertensive heart disease without heart failure: Secondary | ICD-10-CM | POA: Diagnosis not present

## 2022-12-07 DIAGNOSIS — I10 Essential (primary) hypertension: Secondary | ICD-10-CM | POA: Diagnosis not present

## 2022-12-07 DIAGNOSIS — Z1339 Encounter for screening examination for other mental health and behavioral disorders: Secondary | ICD-10-CM | POA: Diagnosis not present

## 2022-12-07 DIAGNOSIS — G912 (Idiopathic) normal pressure hydrocephalus: Secondary | ICD-10-CM | POA: Diagnosis not present

## 2022-12-10 DIAGNOSIS — M797 Fibromyalgia: Secondary | ICD-10-CM | POA: Diagnosis not present

## 2022-12-10 DIAGNOSIS — M5136 Other intervertebral disc degeneration, lumbar region with discogenic back pain only: Secondary | ICD-10-CM | POA: Diagnosis not present

## 2022-12-10 DIAGNOSIS — M2559 Pain in other specified joint: Secondary | ICD-10-CM | POA: Diagnosis not present

## 2022-12-10 DIAGNOSIS — G9332 Myalgic encephalomyelitis/chronic fatigue syndrome: Secondary | ICD-10-CM | POA: Diagnosis not present

## 2023-01-13 DIAGNOSIS — K080 Exfoliation of teeth due to systemic causes: Secondary | ICD-10-CM | POA: Diagnosis not present

## 2023-01-26 DIAGNOSIS — H40013 Open angle with borderline findings, low risk, bilateral: Secondary | ICD-10-CM | POA: Diagnosis not present

## 2023-01-26 DIAGNOSIS — H40053 Ocular hypertension, bilateral: Secondary | ICD-10-CM | POA: Diagnosis not present

## 2023-01-26 DIAGNOSIS — H5212 Myopia, left eye: Secondary | ICD-10-CM | POA: Diagnosis not present

## 2023-02-05 ENCOUNTER — Other Ambulatory Visit: Payer: Self-pay | Admitting: Cardiovascular Disease

## 2023-02-05 DIAGNOSIS — E782 Mixed hyperlipidemia: Secondary | ICD-10-CM

## 2023-02-05 DIAGNOSIS — I712 Thoracic aortic aneurysm, without rupture, unspecified: Secondary | ICD-10-CM

## 2023-02-08 ENCOUNTER — Encounter: Payer: Self-pay | Admitting: Cardiovascular Disease

## 2023-02-08 DIAGNOSIS — E782 Mixed hyperlipidemia: Secondary | ICD-10-CM

## 2023-02-08 DIAGNOSIS — I712 Thoracic aortic aneurysm, without rupture, unspecified: Secondary | ICD-10-CM

## 2023-02-08 DIAGNOSIS — I7121 Aneurysm of the ascending aorta, without rupture: Secondary | ICD-10-CM

## 2023-02-09 ENCOUNTER — Telehealth: Payer: Self-pay | Admitting: Cardiovascular Disease

## 2023-02-09 NOTE — Telephone Encounter (Signed)
 Pt c/o medication issue:  1. Name of Medication: REPATHA  SURECLICK 140 MG/ML SOAJ   2. How are you currently taking this medication (dosage and times per day)?    3. Are you having a reaction (difficulty breathing--STAT)? no  4. What is your medication issue? Patient calling about the grant that she get with this medication. Please advise

## 2023-02-10 NOTE — Telephone Encounter (Signed)
 Enrolled in [employer] grant. Info sent to pt via mychart

## 2023-02-10 NOTE — Telephone Encounter (Signed)
 Called and spoke to [employer] states:   -unable to accept grant information due to patient primary insurance is through the government

## 2023-02-11 ENCOUNTER — Other Ambulatory Visit (HOSPITAL_COMMUNITY): Payer: Self-pay

## 2023-02-11 ENCOUNTER — Encounter (HOSPITAL_COMMUNITY): Payer: Self-pay

## 2023-02-11 ENCOUNTER — Telehealth: Payer: Self-pay

## 2023-02-11 MED ORDER — REPATHA SURECLICK 140 MG/ML ~~LOC~~ SOAJ
140.0000 mg | SUBCUTANEOUS | 3 refills | Status: DC
  Filled 2023-02-11: qty 2, 28d supply, fill #0
  Filled 2023-03-06 – 2023-03-21 (×2): qty 2, 28d supply, fill #1
  Filled 2023-04-14 – 2023-04-18 (×2): qty 2, 28d supply, fill #2
  Filled 2023-05-12 – 2023-05-16 (×2): qty 2, 28d supply, fill #3
  Filled 2023-06-14: qty 2, 28d supply, fill #4
  Filled 2023-07-09: qty 2, 28d supply, fill #5
  Filled 2023-08-06: qty 2, 28d supply, fill #6
  Filled 2023-09-06: qty 2, 28d supply, fill #7
  Filled 2023-10-01: qty 2, 28d supply, fill #8
  Filled 2023-11-03: qty 2, 28d supply, fill #9
  Filled 2023-12-06: qty 2, 28d supply, fill #10
  Filled 2023-12-30: qty 2, 28d supply, fill #11

## 2023-02-11 NOTE — Addendum Note (Signed)
 Addended by: Sunny English on: 02/11/2023 07:07 AM   Modules accepted: Orders

## 2023-02-11 NOTE — Telephone Encounter (Signed)
 [employer] grant info added into [place].

## 2023-02-15 DIAGNOSIS — N200 Calculus of kidney: Secondary | ICD-10-CM | POA: Diagnosis not present

## 2023-02-19 ENCOUNTER — Other Ambulatory Visit (HOSPITAL_COMMUNITY): Payer: Self-pay

## 2023-02-22 ENCOUNTER — Other Ambulatory Visit (HOSPITAL_COMMUNITY): Payer: Self-pay

## 2023-02-22 ENCOUNTER — Telehealth: Payer: Self-pay | Admitting: Pharmacy Technician

## 2023-02-22 NOTE — Telephone Encounter (Signed)
Pharmacy Patient Advocate Encounter   Received notification from Fax that prior authorization for repatha is required/requested.   Insurance verification completed.   The patient is insured through [facility] .   Per test claim: PA required; PA submitted to above mentioned insurance via Fax Key/confirmation #/EOC x Status is pending   Patient started prior auth. I faxed insurance all info. 3:50pm

## 2023-02-24 ENCOUNTER — Other Ambulatory Visit (HOSPITAL_COMMUNITY): Payer: Self-pay

## 2023-02-24 NOTE — Telephone Encounter (Signed)
Now rx is going through and just saying too soon until 03/21/23

## 2023-03-10 ENCOUNTER — Other Ambulatory Visit (HOSPITAL_COMMUNITY): Payer: Self-pay

## 2023-04-05 DIAGNOSIS — N952 Postmenopausal atrophic vaginitis: Secondary | ICD-10-CM | POA: Diagnosis not present

## 2023-04-05 DIAGNOSIS — Q631 Lobulated, fused and horseshoe kidney: Secondary | ICD-10-CM | POA: Diagnosis not present

## 2023-04-05 DIAGNOSIS — N3020 Other chronic cystitis without hematuria: Secondary | ICD-10-CM | POA: Diagnosis not present

## 2023-04-05 DIAGNOSIS — N200 Calculus of kidney: Secondary | ICD-10-CM | POA: Diagnosis not present

## 2023-04-15 ENCOUNTER — Other Ambulatory Visit (HOSPITAL_COMMUNITY): Payer: Self-pay

## 2023-04-25 DIAGNOSIS — R82991 Hypocitraturia: Secondary | ICD-10-CM | POA: Diagnosis not present

## 2023-04-25 DIAGNOSIS — Q631 Lobulated, fused and horseshoe kidney: Secondary | ICD-10-CM | POA: Diagnosis not present

## 2023-04-25 DIAGNOSIS — N200 Calculus of kidney: Secondary | ICD-10-CM | POA: Diagnosis not present

## 2023-05-12 ENCOUNTER — Other Ambulatory Visit (HOSPITAL_COMMUNITY): Payer: Self-pay

## 2023-05-13 ENCOUNTER — Other Ambulatory Visit: Payer: Self-pay | Admitting: Student

## 2023-05-13 ENCOUNTER — Encounter: Payer: Self-pay | Admitting: Cardiovascular Disease

## 2023-05-13 DIAGNOSIS — Z982 Presence of cerebrospinal fluid drainage device: Secondary | ICD-10-CM

## 2023-05-13 DIAGNOSIS — I712 Thoracic aortic aneurysm, without rupture, unspecified: Secondary | ICD-10-CM

## 2023-05-13 DIAGNOSIS — I7121 Aneurysm of the ascending aorta, without rupture: Secondary | ICD-10-CM

## 2023-05-13 DIAGNOSIS — Z01818 Encounter for other preprocedural examination: Secondary | ICD-10-CM

## 2023-05-17 ENCOUNTER — Other Ambulatory Visit: Payer: Self-pay | Admitting: Student

## 2023-05-17 ENCOUNTER — Ambulatory Visit
Admission: RE | Admit: 2023-05-17 | Discharge: 2023-05-17 | Disposition: A | Discharge status: Home / Self Care | Source: Ambulatory Visit | Attending: Student | Admitting: Student

## 2023-05-17 DIAGNOSIS — Z982 Presence of cerebrospinal fluid drainage device: Secondary | ICD-10-CM

## 2023-05-26 ENCOUNTER — Encounter: Payer: Self-pay | Admitting: Cardiovascular Disease

## 2023-06-07 DIAGNOSIS — I119 Hypertensive heart disease without heart failure: Secondary | ICD-10-CM | POA: Diagnosis not present

## 2023-06-07 DIAGNOSIS — N289 Disorder of kidney and ureter, unspecified: Secondary | ICD-10-CM | POA: Diagnosis not present

## 2023-06-20 DIAGNOSIS — Z01818 Encounter for other preprocedural examination: Secondary | ICD-10-CM | POA: Diagnosis not present

## 2023-06-21 LAB — BASIC METABOLIC PANEL WITH GFR
BUN/Creatinine Ratio: 18 (ref 12–28)
BUN: 21 mg/dL (ref 8–27)
CO2: 21 mmol/L (ref 20–29)
Calcium: 11.3 mg/dL — ABNORMAL HIGH (ref 8.7–10.3)
Chloride: 104 mmol/L (ref 96–106)
Creatinine, Ser: 1.17 mg/dL — ABNORMAL HIGH (ref 0.57–1.00)
Glucose: 98 mg/dL (ref 70–99)
Potassium: 5.4 mmol/L — ABNORMAL HIGH (ref 3.5–5.2)
Sodium: 141 mmol/L (ref 134–144)
eGFR: 49 mL/min/{1.73_m2} — ABNORMAL LOW (ref >59)

## 2023-06-27 ENCOUNTER — Ambulatory Visit (HOSPITAL_BASED_OUTPATIENT_CLINIC_OR_DEPARTMENT_OTHER)
Admission: RE | Admit: 2023-06-27 | Discharge: 2023-06-27 | Disposition: A | Discharge status: Home / Self Care | Source: Ambulatory Visit | Attending: Cardiovascular Disease | Admitting: Cardiovascular Disease

## 2023-06-27 DIAGNOSIS — I712 Thoracic aortic aneurysm, without rupture, unspecified: Secondary | ICD-10-CM | POA: Insufficient documentation

## 2023-06-27 DIAGNOSIS — R590 Localized enlarged lymph nodes: Secondary | ICD-10-CM | POA: Diagnosis not present

## 2023-06-27 DIAGNOSIS — N6310 Unspecified lump in the right breast, unspecified quadrant: Secondary | ICD-10-CM | POA: Diagnosis not present

## 2023-06-27 DIAGNOSIS — I7121 Aneurysm of the ascending aorta, without rupture: Secondary | ICD-10-CM | POA: Diagnosis not present

## 2023-06-27 MED ORDER — IOHEXOL 350 MG/ML SOLN
100.0000 mL | Freq: Once | INTRAVENOUS | Status: AC | PRN
  Administered 2023-06-27: 75 mL via INTRAVENOUS

## 2023-07-04 ENCOUNTER — Ambulatory Visit: Payer: Self-pay | Admitting: Cardiovascular Disease

## 2023-07-04 DIAGNOSIS — I7121 Aneurysm of the ascending aorta, without rupture: Secondary | ICD-10-CM

## 2023-07-04 DIAGNOSIS — I712 Thoracic aortic aneurysm, without rupture, unspecified: Secondary | ICD-10-CM

## 2023-07-05 ENCOUNTER — Encounter: Payer: Self-pay | Admitting: Cardiovascular Disease

## 2023-07-05 ENCOUNTER — Ambulatory Visit: Attending: Cardiovascular Disease | Admitting: Cardiovascular Disease

## 2023-07-05 VITALS — BP 130/80 | HR 90 | Ht 64.0 in | Wt 159.0 lb

## 2023-07-05 DIAGNOSIS — E785 Hyperlipidemia, unspecified: Secondary | ICD-10-CM | POA: Diagnosis not present

## 2023-07-05 DIAGNOSIS — I10 Essential (primary) hypertension: Secondary | ICD-10-CM | POA: Diagnosis not present

## 2023-07-05 DIAGNOSIS — R0609 Other forms of dyspnea: Secondary | ICD-10-CM | POA: Diagnosis not present

## 2023-07-05 DIAGNOSIS — I7121 Aneurysm of the ascending aorta, without rupture: Secondary | ICD-10-CM

## 2023-07-05 DIAGNOSIS — I491 Atrial premature depolarization: Secondary | ICD-10-CM | POA: Diagnosis not present

## 2023-07-05 NOTE — Assessment & Plan Note (Signed)
 History of thoracic aortic aneurysm with recent CTA performed 07/04/2023 measuring 41 mm, unchanged, repeat 12 months.

## 2023-07-05 NOTE — Progress Notes (Signed)
 07/05/2023 Jean Scott   1950-02-10  [id number]  Primary Physician Jean Santos, MD Primary Cardiologist: Jean [name] MD Jean Scott, Jean Scott  HPI:  Jean Scott is a 73 y.o.   mildly overweight married Caucasian female with no children whose husband Jean Scott is also a patient of mine.  He accompanies her today.  She was referred by Dr. Avva for cardiovascular valuation because of dyspnea.    I last saw her in the office 03/04/2021.Jean Scott  Her cardiac risk factors are notable for untreated hyperlipidemia.  She is hypertensive today but normally she is not nor she had medicines for this.  She has never smoked.  There is no family history of heart disease.  She is never had a heart attack or stroke.  She denies chest pain.  She does have chronic fatigue syndrome, fibromyalgia and seizure disorder.  She had a 2D echocardiogram performed because of dyspnea and murmur 05/15/2015 which was essentially normal.  She complains of shortness of breath on minimal activity.   She had a 2D echo 03/07/2018 which was essentially normal except for some diastolic dysfunction and a thoracic aorta which was generous.  Follow-up chest CTA performed 06/07/2018 revealed this to be about 4 cm.  The coronary calcium score is measured at 7 making it unlikely that she has CAD although she continues to complain of dyspnea without a clear etiology.  In addition, she has recently become hypertensive and was put on short acting diltiazem  with a blood pressure log at home showing borderline hypertensive readings.  Her lipid profile had been significantly elevated as well.   We adjusted her antihypertensive medications and consolidated her diltiazem .  Blood pressures been under better control.  She was placed on Repatha  with an incredible response, lipid profile performed 01/30/2019 revealed total cholesterol 135, LDL 58 and HDL 48.  She continues to have fatigue and dyspnea on exertion.  Her 2D echo was essentially normal except  for grade 1 diastolic dysfunction and a Myoview  was normal as well.  Chest CT did show a 4 cm ascending thoracic aortic aneurysm.  Since I saw her in the office 2 years ago she has remained stable from a heart point of view.  She has had normal pressure hydrocephalus and has undergone a VP shunt with resolution of her symptoms.  She also had kidney stones.  She denies chest pain but does have some dyspnea on exertion which has not changed severity.  She has an ascending thoracic aortic aneurysm measuring 41 mm by recent CTA.   Current Meds  Medication Sig   amphetamine -dextroamphetamine  (ADDERALL) 20 MG tablet Take 20 mg in the morning and 10 mg at lunch   ampicillin  (PRINCIPEN) 500 MG capsule Take 500 mg by mouth at bedtime.   anti-nausea (EMETROL) solution Take 10 mLs by mouth 2 (two) times daily.   bacitracin  ointment Apply 1 Application topically 2 (two) times daily.   Biotin  5000 MCG TABS Take 5,000 mcg by mouth daily.   Cholecalciferol (VITAMIN D3) 2000 units TABS Take 2,000 Units by mouth daily.   clindamycin (CLINDAGEL) 1 % gel Apply topically daily.   clonazePAM  (KLONOPIN ) 1 MG tablet Take .75 mg at bedtime and .25 mg in the morning   diphenhydrAMINE -APAP, sleep, 38-500 MG TABS Take 1 tablet by mouth at bedtime. excedrin   estradiol  (ESTRACE ) 0.1 MG/GM vaginal cream Place 1 Applicatorful vaginally daily.   Evolocumab  (REPATHA  SURECLICK) 140 MG/ML SOAJ Inject 140 mg into the skin  every 14 (fourteen) days.   fluticasone  (FLONASE ) 50 MCG/ACT nasal spray Place 1 spray into both nostrils daily.   KLONOPIN  0.5 MG tablet Take 0.5 mg by mouth 2 (two) times daily.   levothyroxine  (SYNTHROID ) 100 MCG tablet Take 100 mcg by mouth at bedtime.   Melatonin 2.5 MG CAPS Place 2.5 mg under the tongue at bedtime.   meloxicam (MOBIC) 15 MG tablet Take 15 mg by mouth daily.   mometasone  (ELOCON ) 0.1 % cream Apply 1 Application topically daily as needed (skin break-out).   Omega-3 Fatty Acids (FISH OIL)  1200 MG CAPS Take 2 capsules by mouth daily. 350 omega 3   OVER THE COUNTER MEDICATION Take 500 mg by mouth at bedtime. l-serine   polyethylene glycol (MIRALAX  / GLYCOLAX ) 17 g packet Take 17 g by mouth daily as needed for mild constipation or moderate constipation.   potassium bicarbonate (K-LYTE) 25 MEQ disintegrating tablet Take 25 mEq by mouth at bedtime.   potassium bicarbonate (KLOR-CON /EF) 25 MEQ disintegrating tablet Take 25 mEq by mouth 2 (two) times daily.   simethicone  (MYLICON) 80 MG chewable tablet Chew 80 mg by mouth every 6 (six) hours as needed for flatulence (Pain).     Allergies  Allergen Reactions   Cefdinir Diarrhea   Sulfamethoxazole-Trimethoprim Other (See Comments)    Anxiety, jittery   Alprazolam Other (See Comments)    Get too groggy can't function   Chlorthalidone     Constipation    Ciprofloxacin     Pt gets dehydrated   Epinephrine      Makes pt feel like they are coming out of their skin   Potassium Citrate      Constipation; CSF crash so I wasn't functioning well at all   Metronidazole  Rash    Social History   Socioeconomic History   Marital status: Married    Spouse name: Not on file   Number of children: 0   Years of education: Not on file   Highest education level: Not on file  Occupational History   Occupation: Disabled  Tobacco Use   Smoking status: Never   Smokeless tobacco: Never  Vaping Use   Vaping status: Never Used  Substance and Sexual Activity   Alcohol  use: No   Drug use: No   Sexual activity: Not Currently    Birth control/protection: Surgical    Comment: Hysterectomy  Other Topics Concern   Not on file  Social History Narrative   Right handed   Drinks caffeine   Two story home   Lives with husband in a two story   retired   Social Drivers of Corporate investment banker Strain: Not on file  Food Insecurity: No Food Insecurity (10/12/2021)   [facility] from [employer], [facility] visits  prior to 03/06/2022.   Hunger Vital Sign    Worried About Programme researcher, broadcasting/film/video in the Last Year: Never true    Ran Out of Food in the Last Year: Never true  Transportation Needs: Not on file  Physical Activity: Not on file  Stress: Not on file  Social Connections: Not on file  Intimate Partner Violence: Not on file     Review of Systems: General: negative for chills, fever, night sweats or weight changes.  Cardiovascular: negative for chest pain, dyspnea on exertion, edema, orthopnea, palpitations, paroxysmal nocturnal dyspnea or shortness of breath Dermatological: negative for rash Respiratory: negative for cough or wheezing Urologic: negative for hematuria Abdominal: negative for nausea, vomiting, diarrhea,  bright red blood per rectum, melena, or hematemesis Neurologic: negative for visual changes, syncope, or dizziness All other systems reviewed and are otherwise negative except as noted above.    Blood pressure 130/80, pulse 90, height 5' 4 (1.626 m), weight 159 lb (72.1 kg), SpO2 97%.  General appearance: alert and no distress Neck: no adenopathy, no carotid bruit, no JVD, supple, symmetrical, trachea midline, and thyroid  not enlarged, symmetric, no tenderness/mass/nodules Lungs: clear to auscultation bilaterally Heart: regular rate and rhythm, S1, S2 normal, no murmur, click, rub or gallop Extremities: extremities normal, atraumatic, no cyanosis or edema Pulses: 2+ and symmetric Skin: Skin color, texture, turgor normal. No rashes or lesions Neurologic: Grossly normal  EKG EKG Interpretation Date/Time:  Tuesday July 05 2023 14:07:11 EDT Ventricular Rate:  90 PR Interval:  168 QRS Duration:  68 QT Interval:  334 QTC Calculation: 408 R Axis:   26  Text Interpretation: Sinus rhythm with Premature atrial complexes Nonspecific ST and T wave abnormality When compared with ECG of 28-Jun-2022 21:17, Premature atrial complexes are now Present Vent. rate has increased BY  44 BPM  Nonspecific T wave abnormality no longer evident in Lateral leads Confirmed by Court Carrier [phone]) on 07/05/2023 2:24:55 PM    ASSESSMENT AND PLAN:   Hyperlipidemia History of dyspnea on exertion on Repatha  (not on statin because of fibromyalgia) with lipid profile performed 11/30/2022 revealing total cholesterol 125, LDL 54 and HDL 44.  Essential hypertension History of essential hypertension with blood pressure measured today at 130/80.  She is not on antihypertensive medications.  Thoracic aortic aneurysm (HCC) History of thoracic aortic aneurysm with recent CTA performed 07/04/2023 measuring 41 mm, unchanged, repeat 12 months.     Carrier DOROTHA Court MD FACP,FACC,FAHA, [facility] 07/05/2023 2:32 PM

## 2023-07-05 NOTE — Patient Instructions (Signed)
 Medication Instructions:  Your physician recommends that you continue on your current medications as directed. Please refer to the Current Medication list given to you today.  *If you need a refill on your cardiac medications before your next appointment, please call your pharmacy*  Lab Work: If you have labs (blood work) drawn today and your tests are completely normal, you will receive your results only by: MyChart Message (if you have MyChart) OR A paper copy in the mail If you have any lab test that is abnormal or we need to change your treatment, we will call you to review the results.  Testing/Procedures: Your physician has requested that you have cardiac CT of chest. Cardiac computed tomography (CT) is a painless test that uses an x-ray machine to take clear, detailed pictures of your heart. For further information please visit [email or web address] Please follow instruction sheet as given.  Follow-Up: At [facility], you and your health needs are our priority.  As part of our continuing mission to provide you with exceptional heart care, our providers are all part of one team.  This team includes your primary Cardiologist (physician) and Advanced Practice Providers or APPs (Physician Assistants and Nurse Practitioners) who all work together to provide you with the care you need, when you need it.  Your next appointment:   1 year(s)  Provider:   Dorn Lesches, MD    We recommend signing up for the patient portal called MyChart.  Sign up information is provided on this After Visit Summary.  MyChart is used to connect with patients for Virtual Visits (Telemedicine).  Patients are able to view lab/test results, encounter notes, upcoming appointments, etc.  Non-urgent messages can be sent to your provider as well.   To learn more about what you can do with MyChart, go to [email or web address].

## 2023-07-05 NOTE — Assessment & Plan Note (Signed)
 History of essential hypertension with blood pressure measured today at 130/80.  She is not on antihypertensive medications.

## 2023-07-05 NOTE — Assessment & Plan Note (Signed)
 History of dyspnea on exertion on Repatha  (not on statin because of fibromyalgia) with lipid profile performed 11/30/2022 revealing total cholesterol 125, LDL 54 and HDL 44.

## 2023-07-11 ENCOUNTER — Other Ambulatory Visit (HOSPITAL_COMMUNITY): Payer: Self-pay

## 2023-08-08 ENCOUNTER — Other Ambulatory Visit (HOSPITAL_COMMUNITY): Payer: Self-pay

## 2023-08-12 DIAGNOSIS — N2889 Other specified disorders of kidney and ureter: Secondary | ICD-10-CM | POA: Diagnosis not present

## 2023-08-12 DIAGNOSIS — N200 Calculus of kidney: Secondary | ICD-10-CM | POA: Diagnosis not present

## 2023-08-12 DIAGNOSIS — Q631 Lobulated, fused and horseshoe kidney: Secondary | ICD-10-CM | POA: Diagnosis not present

## 2023-08-12 DIAGNOSIS — R310 Gross hematuria: Secondary | ICD-10-CM | POA: Diagnosis not present

## 2023-08-12 DIAGNOSIS — K449 Diaphragmatic hernia without obstruction or gangrene: Secondary | ICD-10-CM | POA: Diagnosis not present

## 2023-08-26 DIAGNOSIS — N200 Calculus of kidney: Secondary | ICD-10-CM | POA: Diagnosis not present

## 2023-09-06 DIAGNOSIS — M5416 Radiculopathy, lumbar region: Secondary | ICD-10-CM | POA: Diagnosis not present

## 2023-09-14 DIAGNOSIS — I119 Hypertensive heart disease without heart failure: Secondary | ICD-10-CM | POA: Diagnosis not present

## 2023-10-05 DIAGNOSIS — N200 Calculus of kidney: Secondary | ICD-10-CM | POA: Diagnosis not present

## 2023-10-05 DIAGNOSIS — N3021 Other chronic cystitis with hematuria: Secondary | ICD-10-CM | POA: Diagnosis not present

## 2023-10-05 DIAGNOSIS — N209 Urinary calculus, unspecified: Secondary | ICD-10-CM | POA: Diagnosis not present

## 2023-10-11 DIAGNOSIS — Z982 Presence of cerebrospinal fluid drainage device: Secondary | ICD-10-CM | POA: Diagnosis not present

## 2023-10-11 DIAGNOSIS — M5440 Lumbago with sciatica, unspecified side: Secondary | ICD-10-CM | POA: Diagnosis not present

## 2023-10-11 DIAGNOSIS — Z6826 Body mass index (BMI) 26.0-26.9, adult: Secondary | ICD-10-CM | POA: Diagnosis not present

## 2023-10-11 DIAGNOSIS — G912 (Idiopathic) normal pressure hydrocephalus: Secondary | ICD-10-CM | POA: Diagnosis not present

## 2023-10-13 ENCOUNTER — Other Ambulatory Visit (HOSPITAL_BASED_OUTPATIENT_CLINIC_OR_DEPARTMENT_OTHER): Payer: Self-pay | Admitting: Neurosurgery

## 2023-10-13 DIAGNOSIS — M5440 Lumbago with sciatica, unspecified side: Secondary | ICD-10-CM

## 2023-10-15 ENCOUNTER — Ambulatory Visit (HOSPITAL_BASED_OUTPATIENT_CLINIC_OR_DEPARTMENT_OTHER)
Admission: RE | Admit: 2023-10-15 | Discharge: 2023-10-15 | Disposition: A | Discharge status: Home / Self Care | Source: Ambulatory Visit | Attending: Neurosurgery | Admitting: Neurosurgery

## 2023-10-15 DIAGNOSIS — M5440 Lumbago with sciatica, unspecified side: Secondary | ICD-10-CM | POA: Diagnosis not present

## 2023-10-15 MED ORDER — GADOBUTROL 1 MMOL/ML IV SOLN
7.0000 mL | Freq: Once | INTRAVENOUS | Status: AC | PRN
  Administered 2023-10-15: 7 mL via INTRAVENOUS
  Filled 2023-10-15: qty 7.5

## 2023-10-25 DIAGNOSIS — Z6827 Body mass index (BMI) 27.0-27.9, adult: Secondary | ICD-10-CM | POA: Diagnosis not present

## 2023-10-25 DIAGNOSIS — M5126 Other intervertebral disc displacement, lumbar region: Secondary | ICD-10-CM | POA: Diagnosis not present

## 2023-10-31 ENCOUNTER — Other Ambulatory Visit: Payer: Self-pay | Admitting: Neurosurgery

## 2023-11-01 ENCOUNTER — Telehealth (HOSPITAL_BASED_OUTPATIENT_CLINIC_OR_DEPARTMENT_OTHER): Payer: Self-pay

## 2023-11-01 NOTE — Telephone Encounter (Signed)
   Pre-operative Risk Assessment    Patient Name: Jean Scott  DOB: [birth year] MRN: [id number]   Date of last office visit: 07/05/23 with Dr. Court  Date of next office visit: NA   Request for Surgical Clearance    Procedure:  L4-5 Lumbar Microdiskectomy  Date of Surgery:  Clearance 11/23/23                                Surgeon:  Dr. Arley Helling Surgeon's Group or Practice Name:  [facility] Phone number:  [phone] Fax number:  [phone]   Type of Clearance Requested:   - Medical    Type of Anesthesia:  General    Additional requests/questions:    SignedAugustin JONETTA Daring   11/01/2023, 9:13 AM

## 2023-11-01 NOTE — Telephone Encounter (Signed)
   Name: Jean Scott  DOB: 05-06-1950  MRN: [id number]  Primary Cardiologist: Dorn Lesches, MD   Preoperative team, please contact this patient and set up a phone call appointment for further preoperative risk assessment. Please obtain consent and complete medication review. Thank you for your help.  I confirm that guidance regarding antiplatelet and oral anticoagulation therapy has been completed and, if necessary, noted below.  None.   I also confirmed the patient resides in the state of [place] . As per [facility] Medical Board telemedicine laws, the patient must reside in the state in which the provider is licensed.   Lum LITTIE Louis, NP 11/01/2023, 4:03 PM  [facility]

## 2023-11-01 NOTE — Telephone Encounter (Signed)
Left message to call back and schedule tele pre op appt

## 2023-11-02 ENCOUNTER — Telehealth (HOSPITAL_BASED_OUTPATIENT_CLINIC_OR_DEPARTMENT_OTHER): Payer: Self-pay | Admitting: *Deleted

## 2023-11-02 ENCOUNTER — Encounter: Payer: Self-pay | Admitting: Cardiovascular Disease

## 2023-11-02 NOTE — Telephone Encounter (Signed)
 Pt has been tele preop appt 11/16/23.

## 2023-11-02 NOTE — Telephone Encounter (Signed)
 Pt returned phone call to f/u please advise

## 2023-11-02 NOTE — Telephone Encounter (Signed)
 SEE NOTES IN CHART FOR PREOP

## 2023-11-02 NOTE — Telephone Encounter (Signed)
 Pt has been scheduled tele preop appt 11/16/23. Med rec and consent are done.     Patient Consent for Virtual Visit        Jean Scott has provided verbal consent on 11/02/2023 for a virtual visit (video or telephone).   CONSENT FOR VIRTUAL VISIT FOR:  Jean Scott  By participating in this virtual visit I agree to the following:  I hereby voluntarily request, consent and authorize [facility] and its employed or contracted physicians, physician assistants, nurse practitioners or other licensed health care professionals (the Practitioner), to provide me with telemedicine health care services (the "Services) as deemed necessary by the treating Practitioner. I acknowledge and consent to receive the Services by the Practitioner via telemedicine. I understand that the telemedicine visit will involve communicating with the Practitioner through live audiovisual communication technology and the disclosure of certain medical information by electronic transmission. I acknowledge that I have been given the opportunity to request an in-person assessment or other available alternative prior to the telemedicine visit and am voluntarily participating in the telemedicine visit.  I understand that I have the right to withhold or withdraw my consent to the use of telemedicine in the course of my care at any time, without affecting my right to future care or treatment, and that the Practitioner or I may terminate the telemedicine visit at any time. I understand that I have the right to inspect all information obtained and/or recorded in the course of the telemedicine visit and may receive copies of available information for a reasonable fee.  I understand that some of the potential risks of receiving the Services via telemedicine include:  Delay or interruption in medical evaluation due to technological equipment failure or disruption; Information transmitted may not be sufficient (e.g. poor  resolution of images) to allow for appropriate medical decision making by the Practitioner; and/or  In rare instances, security protocols could fail, causing a breach of personal health information.  Furthermore, I acknowledge that it is my responsibility to provide information about my medical history, conditions and care that is complete and accurate to the best of my ability. I acknowledge that Practitioner's advice, recommendations, and/or decision may be based on factors not within their control, such as incomplete or inaccurate data provided by me or distortions of diagnostic images or specimens that may result from electronic transmissions. I understand that the practice of medicine is not an exact science and that Practitioner makes no warranties or guarantees regarding treatment outcomes. I acknowledge that a copy of this consent can be made available to me via my patient portal [facility] MyChart), or I can request a printed copy by calling the office of [facility].    I understand that my insurance will be billed for this visit.   I have read or had this consent read to me. I understand the contents of this consent, which adequately explains the benefits and risks of the Services being provided via telemedicine.  I have been provided ample opportunity to ask questions regarding this consent and the Services and have had my questions answered to my satisfaction. I give my informed consent for the services to be provided through the use of telemedicine in my medical care

## 2023-11-03 DIAGNOSIS — R82991 Hypocitraturia: Secondary | ICD-10-CM | POA: Diagnosis not present

## 2023-11-03 DIAGNOSIS — N200 Calculus of kidney: Secondary | ICD-10-CM | POA: Diagnosis not present

## 2023-11-03 DIAGNOSIS — Q631 Lobulated, fused and horseshoe kidney: Secondary | ICD-10-CM | POA: Diagnosis not present

## 2023-11-05 ENCOUNTER — Other Ambulatory Visit (HOSPITAL_COMMUNITY): Payer: Self-pay

## 2023-11-13 ENCOUNTER — Other Ambulatory Visit (HOSPITAL_COMMUNITY): Payer: Self-pay

## 2023-11-16 ENCOUNTER — Ambulatory Visit: Attending: Student in an Organized Health Care Education/Training Program | Admitting: Emergency Medicine

## 2023-11-16 DIAGNOSIS — Z01810 Encounter for preprocedural cardiovascular examination: Secondary | ICD-10-CM | POA: Diagnosis not present

## 2023-11-16 NOTE — Progress Notes (Signed)
 Surgical Instructions   Your procedure is scheduled on Wednesday November 23, 2023. Report to [facility] Main Entrance A at 6:30 A.M., then check in with the Admitting office. Any questions or running late day of surgery: call [phone]  Questions prior to your surgery date: call [phone], Monday-Friday, 8am-4pm. If you experience any cold or flu symptoms such as cough, fever, chills, shortness of breath, etc. between now and your scheduled surgery, please notify us  at the above number.     Remember:  Do not eat or drink after midnight the night before your surgery  Take these medicines the morning of surgery with A SIP OF WATER  clonazePAM  (KLONOPIN )  fluticasone  (FLONASE )  traMADol (ULTRAM)   May take these medicines IF NEEDED: None  One week prior to surgery, STOP taking any Aspirin  (unless otherwise instructed by your surgeon) Aleve, Naproxen, Ibuprofen, Motrin, Advil, Goody's, BC's, all herbal medications, fish oil, and non-prescription vitamins. This includes your meloxicam (MOBIC) and  Omega-3 Fatty Acids (FISH OIL).                       Do NOT Smoke (Tobacco/Vaping) for 24 hours prior to your procedure.  If you use a CPAP at night, you may bring your mask/headgear for your overnight stay.   You will be asked to remove any contacts, glasses, piercing's, hearing aid's, dentures/partials prior to surgery. Please bring cases for these items if needed.    Patients discharged the day of surgery will not be allowed to drive home, and someone needs to stay with them for 24 hours.  SURGICAL WAITING ROOM VISITATION Patients may have no more than 2 support people in the waiting area - these visitors may rotate.   Pre-op nurse will coordinate an appropriate time for 1 ADULT support person, who may not rotate, to accompany patient in pre-op.  Children under the age of 43 must have an adult with them who is not the patient and must remain in the main waiting area with an  adult.  If the patient needs to stay at the hospital during part of their recovery, the visitor guidelines for inpatient rooms apply.  Please refer to the [facility] website for the visitor guidelines for any additional information.   If you received a COVID test during your pre-op visit  it is requested that you wear a mask when out in public, stay away from anyone that may not be feeling well and notify your surgeon if you develop symptoms. If you have been in contact with anyone that has tested positive in the last 10 days please notify you surgeon.      Pre-operative 4 CHG Bathing Instructions   You can play a key role in reducing the risk of infection after surgery. Your skin needs to be as free of germs as possible. You can reduce the number of germs on your skin by washing with CHG (chlorhexidine  gluconate) soap before surgery. CHG is an antiseptic soap that kills germs and continues to kill germs even after washing.   DO NOT use if you have an allergy to chlorhexidine /CHG or antibacterial soaps. If your skin becomes reddened or irritated, stop using the CHG and notify one of our RNs at [phone].   Please shower with the CHG soap starting 4 days before surgery using the following schedule:     Please keep in mind the following:  DO NOT shave, including legs and underarms, starting the day of your first  shower.   Place clean sheets on your bed the day you start using CHG soap. Use a clean washcloth (not used since being washed) for each shower. DO NOT sleep with pets once you start using the CHG.   CHG Shower Instructions:  Wash your face and private area with normal soap. If you choose to wash your hair, wash first with your normal shampoo.  After you use shampoo/soap, rinse your hair and body thoroughly to remove shampoo/soap residue.  Turn the water OFF and apply  bottle of CHG soap to a CLEAN washcloth.  Apply CHG soap ONLY FROM YOUR NECK DOWN TO YOUR TOES (washing for  3-5 minutes)  DO NOT use CHG soap on face, private areas, open wounds, or sores.  Pay special attention to the area where your surgery is being performed.  If you are having back surgery, having someone wash your back for you may be helpful. Wait 2 minutes after CHG soap is applied, then you may rinse off the CHG soap.  Pat dry with a clean towel  Put on clean clothes/pajamas   If you choose to wear lotion, please use ONLY the CHG-compatible lotions that are listed below.  Additional instructions for the day of surgery:  If you choose, you may shower the morning of surgery with an antibacterial soap.  DO NOT APPLY any lotions, deodorants or perfumes.   Do not bring valuables to the hospital. [facility] is not responsible for any belongings/valuables. Do not wear nail polish, gel polish, artificial nails, or any other type of covering on natural nails (fingers and toes) Do not wear jewelry or makeup Put on clean/comfortable clothes.  Please brush your teeth.  Ask your nurse before applying any prescription medications to the skin.     CHG Compatible Lotions   Aveeno Moisturizing lotion  Cetaphil Moisturizing Cream  Cetaphil Moisturizing Lotion  Clairol Herbal Essence Moisturizing Lotion, Dry Skin  Clairol Herbal Essence Moisturizing Lotion, Extra Dry Skin  Clairol Herbal Essence Moisturizing Lotion, Normal Skin  Curel Age Defying Therapeutic Moisturizing Lotion with Alpha Hydroxy  Curel Extreme Care Body Lotion  Curel Soothing Hands Moisturizing Hand Lotion  Curel Therapeutic Moisturizing Cream, Fragrance-Free  Curel Therapeutic Moisturizing Lotion, Fragrance-Free  Curel Therapeutic Moisturizing Lotion, Original Formula  Eucerin Daily Replenishing Lotion  Eucerin Dry Skin Therapy Plus Alpha Hydroxy Crme  Eucerin Dry Skin Therapy Plus Alpha Hydroxy Lotion  Eucerin Original Crme  Eucerin Original Lotion  Eucerin Plus Crme Eucerin Plus Lotion  Eucerin TriLipid Replenishing  Lotion  Keri Anti-Bacterial Hand Lotion  Keri Deep Conditioning Original Lotion Dry Skin Formula Softly Scented  Keri Deep Conditioning Original Lotion, Fragrance Free Sensitive Skin Formula  Keri Lotion Fast Absorbing Fragrance Free Sensitive Skin Formula  Keri Lotion Fast Absorbing Softly Scented Dry Skin Formula  Keri Original Lotion  Keri Skin Renewal Lotion Keri Silky Smooth Lotion  Keri Silky Smooth Sensitive Skin Lotion  Nivea Body Creamy Conditioning Oil  Nivea Body Extra Enriched Lotion  Nivea Body Original Lotion  Nivea Body Sheer Moisturizing Lotion Nivea Crme  Nivea Skin Firming Lotion  NutraDerm 30 Skin Lotion  NutraDerm Skin Lotion  NutraDerm Therapeutic Skin Cream  NutraDerm Therapeutic Skin Lotion  ProShield Protective Hand Cream  Provon moisturizing lotion  Please read over the following fact sheets that you were given.

## 2023-11-16 NOTE — Progress Notes (Signed)
 Virtual Visit via Telephone Note   Because of Jean Scott co-morbid illnesses, she is at least at moderate risk for complications without adequate follow up.  This format is felt to be most appropriate for this patient at this time.  Due to technical limitations with video connection (technology), today's appointment will be conducted as an audio only telehealth visit, and Jean Scott verbally agreed to proceed in this manner.   All issues noted in this document were discussed and addressed.  No physical exam could be performed with this format.  Evaluation Performed:  Preoperative cardiovascular risk assessment _____________   Date:  11/16/2023   Patient ID:  Jean Scott, DOB 1950-01-29, MRN [id number] Patient Location:  Home Provider location:   Office  Primary Care Provider:  Janey Santos, MD Primary Cardiologist:  Dorn Lesches, MD  Chief Complaint / Patient Profile   73 y.o. y/o female with a h/o HTN, hyperlipidemia, ascending aortic aneurysm, fibromyalgia, chronic fatigue, seizure disorder, and cardiac murmur, normal pressure hydrocephalus s/p VP shunt who is pending L4-5 Lumbar Microdiskectomy on 11/23/2023 and presents today for telephonic preoperative cardiovascular risk assessment.  History of Present Illness    Jean Scott is a 73 y.o. female who presents via audio/video conferencing for a telehealth visit today.  Pt was last seen in cardiology clinic on 07/05/2023 by Dr. Lesches.  At that time Jean Scott was doing well.  The patient is now pending procedure as outlined above. Since her last visit, patient began to have debilitating back pain requiring the use of a cane for assistance with walking. This has significantly limited her physical mobility, and is the reason for the pending surgery. She denies chest pain, palpitations, dyspnea, orthopnea, n, v, dark/tarry/bloody stools, dizziness, syncope, weight gain. She does report some mild lower extremity edema  that comes and goes on an occasional basis. Patient states that prior to her debilitating back pain that began in August, she could perform her ADLs without assistance and had no difficulties with up to moderate intensity activities.   Past Medical History    Past Medical History:  Diagnosis Date   Allergic rhinitis    Dust mites, mold, plant polens   Anemia 1970's   Arm fracture 1957   Arthritis    back   Depression 1991   6 months   Elevated uric acid in blood    Fibroids    Fibromyalgia 1990   Fibrosis, breast    Mammogram   GERD (gastroesophageal reflux disease)    Glucose intolerance (impaired glucose tolerance)    Grand mal seizure (HCC) 1960   Hemorrhoids, internal 10/12/2005   History of hypercalcemia    History of kidney stones    Hyperlipidemia    Hypothyroidism 1990   Osteopenia 2012   Bone density Test   Seizures (HCC)    as a child, none as an adult   Urinary tract infection    Past Surgical History:  Procedure Laterality Date   ABDOMINAL HYSTERECTOMY  1989   open surgery for severe endometriosis but did initially have laproscopic   cataracts bilat     10/24/14 10/31/2014   CYSTOSCOPY     stent removal-[facility]-Jan 25,2017   Laparotomy  1987   [facility]  10/12/2021   at [facility] for kidney stones   URETEROSCOPY     Jan 10,2017   VENTRICULOPERITONEAL SHUNT Right 06/28/2022   Procedure: Right - Parietal ventriculoperitoneal shunt placement;  Surgeon: Onetha Kuba, MD;  Location: [facility]  OR;  Service: Neurosurgery;  Laterality: Right;    Allergies  Allergies  Allergen Reactions   Cefdinir Diarrhea   Sulfamethoxazole-Trimethoprim Other (See Comments)    Anxiety, jittery   Alprazolam Other (See Comments)    Get too groggy can't function   Chlorthalidone     Constipation    Ciprofloxacin     Pt gets dehydrated   Epinephrine      Makes pt feel like they are coming out of their skin   Potassium Citrate      Constipation; CSF crash so I wasn't functioning well  at all   Metronidazole  Rash    Home Medications    Prior to Admission medications   Medication Sig Start Date End Date Taking? Authorizing Provider  amphetamine -dextroamphetamine  (ADDERALL) 20 MG tablet Take 20 mg in the morning and 10 mg at lunch 09/03/15   Aneita Gwendlyn DASEN, MD  anti-nausea (EMETROL) solution Take 10 mLs by mouth 2 (two) times daily. Patient taking differently: Take 10 mLs by mouth 3 (three) times daily as needed for vomiting or nausea. 09/03/15   Aneita Gwendlyn DASEN, MD  bacitracin  ointment Apply 1 Application topically 2 (two) times daily as needed (cuts/scrapes).    [provider]  Biotin  5000 MCG TABS Take 5,000 mcg by mouth in the morning. 09/21/21   [provider]  cholecalciferol (VITAMIN D3) 25 MCG (1000 UNIT) tablet Take 1,000 Units by mouth in the morning.    [provider]  clindamycin (CLINDAGEL) 1 % gel Apply 1 Application topically 2 (two) times daily as needed (skin irritation.). 06/07/23   [provider]  clonazePAM  (KLONOPIN ) 0.5 MG tablet Take 0.25-0.75 mg by mouth See admin instructions. Take 0.5 tablet (0.25 mg) by mouth every morning & take 1.5 tablets (0.75 mg) by mouth at bedtime.    [provider]  diphenhydrAMINE -APAP, sleep, (EXCEDRIN PM) 38-500 MG TABS Take 1 tablet by mouth at bedtime.    [provider]  estradiol  (ESTRACE ) 0.1 MG/GM vaginal cream Place 1 Applicatorful vaginally at bedtime. Apply a pea sized amount nightly    [provider]  Evolocumab  (REPATHA  SURECLICK) 140 MG/ML SOAJ Inject 140 mg into the skin every 14 (fourteen) days. 02/11/23   Court Dorn PARAS, MD  fluticasone  (FLONASE ) 50 MCG/ACT nasal spray Place 1 spray into both nostrils in the morning and at bedtime.    [provider]  fosfomycin (MONUROL) 3 g PACK Take 3 g by mouth See admin instructions. Take 1 packet (3 g) by mouth every 10 days    [provider]  L-Serine POWD Take 500 mg by mouth at  bedtime.    [provider]  levothyroxine  (SYNTHROID ) 100 MCG tablet Take 100 mcg by mouth at bedtime.    [provider]  Melatonin 2.5 MG CAPS Place 2.5 mg under the tongue at bedtime.    [provider]  meloxicam (MOBIC) 15 MG tablet Take 15 mg by mouth in the morning. 06/07/23   [provider]  mometasone  (ELOCON ) 0.1 % cream Apply 1 Application topically daily as needed (skin break-out). 09/08/13   [provider]  Omega-3 Fatty Acids (FISH OIL) 1200 MG CAPS Take 2 capsules by mouth daily. 350 omega 3    [provider]  potassium bicarbonate (KLOR-CON /EF) 25 MEQ disintegrating tablet Take 25 mEq by mouth 2 (two) times daily. 04/25/23   [provider]  simethicone  (MYLICON) 80 MG chewable tablet Chew 80 mg by mouth every 6 (six) hours as  needed for flatulence (Pain).    [provider]  traMADol (ULTRAM) 50 MG tablet Take 50 mg by mouth in the morning and at bedtime.    [provider]    Physical Exam    Vital Signs:  Jean Scott does not have vital signs available for review today.  Given telephonic nature of communication, physical exam is limited. AAOx3. NAD. Normal affect.  Speech and respirations are unlabored.  Accessory Clinical Findings    None  Assessment & Plan    1.  Preoperative Cardiovascular Risk Assessment: According to the Revised Cardiac Risk Index (RCRI), her Perioperative Risk of Major Cardiac Event is (%): 0.4 Her Functional Capacity in METs is: 3.97 according to the [facility] Activity Status Index (DASI). Prior to the onset of her debilitating back pain in August, patient was able to achieve a METS of 5.72. Suspect acute decrease of functional status today is more of a reflection of her physical limitations secondary to back pain, not cardiac function. Therefore, based on ACC/AHA guidelines, patient would be at acceptable risk for the planned procedure without further cardiovascular  testing.   The patient was advised that if she develops new symptoms prior to surgery to contact our office to arrange for a follow-up visit, and she verbalized understanding.  A copy of this note will be routed to requesting surgeon.  Time:   Today, I have spent 8 minutes with the patient with telehealth technology discussing medical history, symptoms, and management plan.     Jean Walen E Darionna Banke, NP  11/16/2023, 4:11 PM

## 2023-11-17 ENCOUNTER — Inpatient Hospital Stay (HOSPITAL_COMMUNITY)
Admission: RE | Admit: 2023-11-17 | Discharge: 2023-11-17 | Disposition: A | Discharge status: Home / Self Care | Source: Ambulatory Visit

## 2023-11-21 ENCOUNTER — Encounter (HOSPITAL_COMMUNITY)
Admission: RE | Admit: 2023-11-21 | Discharge: 2023-11-21 | Disposition: A | Discharge status: Home / Self Care | Source: Ambulatory Visit | Attending: Neurosurgery | Admitting: Neurosurgery

## 2023-11-21 ENCOUNTER — Encounter (HOSPITAL_COMMUNITY): Payer: Self-pay

## 2023-11-21 ENCOUNTER — Other Ambulatory Visit: Payer: Self-pay

## 2023-11-21 VITALS — BP 118/78 | HR 113 | Temp 97.9°F | Resp 18 | Ht 64.0 in | Wt 153.5 lb

## 2023-11-21 DIAGNOSIS — Z01812 Encounter for preprocedural laboratory examination: Secondary | ICD-10-CM | POA: Diagnosis not present

## 2023-11-21 DIAGNOSIS — Z01818 Encounter for other preprocedural examination: Secondary | ICD-10-CM

## 2023-11-21 DIAGNOSIS — I10 Essential (primary) hypertension: Secondary | ICD-10-CM | POA: Insufficient documentation

## 2023-11-21 DIAGNOSIS — E039 Hypothyroidism, unspecified: Secondary | ICD-10-CM | POA: Insufficient documentation

## 2023-11-21 DIAGNOSIS — Z982 Presence of cerebrospinal fluid drainage device: Secondary | ICD-10-CM | POA: Insufficient documentation

## 2023-11-21 DIAGNOSIS — M797 Fibromyalgia: Secondary | ICD-10-CM | POA: Insufficient documentation

## 2023-11-21 HISTORY — DX: Thoracic aortic aneurysm, without rupture, unspecified: I71.20

## 2023-11-21 HISTORY — DX: Personal history of other diseases of the digestive system: Z87.19

## 2023-11-21 LAB — CBC
HCT: 49.4 % — ABNORMAL HIGH (ref 36.0–46.0)
Hemoglobin: 15.7 g/dL — ABNORMAL HIGH (ref 12.0–15.0)
MCH: 28.1 pg (ref 26.0–34.0)
MCHC: 31.8 g/dL (ref 30.0–36.0)
MCV: 88.5 fL (ref 80.0–100.0)
Platelets: 366 K/uL (ref 150–400)
RBC: 5.58 MIL/uL — ABNORMAL HIGH (ref 3.87–5.11)
RDW: 13.6 % (ref 11.5–15.5)
WBC: 10.4 K/uL (ref 4.0–10.5)
nRBC: 0 % (ref 0.0–0.2)

## 2023-11-21 LAB — SURGICAL PCR SCREEN
MRSA, PCR: NEGATIVE
Staphylococcus aureus: NEGATIVE

## 2023-11-21 LAB — BASIC METABOLIC PANEL WITH GFR
Anion gap: 12 (ref 5–15)
BUN: 19 mg/dL (ref 8–23)
CO2: 25 mmol/L (ref 22–32)
Calcium: 11.4 mg/dL — ABNORMAL HIGH (ref 8.9–10.3)
Chloride: 105 mmol/L (ref 98–111)
Creatinine, Ser: 1.16 mg/dL — ABNORMAL HIGH (ref 0.44–1.00)
GFR, Estimated: 50 mL/min — ABNORMAL LOW (ref >60)
Glucose, Bld: 123 mg/dL — ABNORMAL HIGH (ref 70–99)
Potassium: 4.6 mmol/L (ref 3.5–5.1)
Sodium: 142 mmol/L (ref 135–145)

## 2023-11-21 NOTE — Progress Notes (Signed)
 PCP - Dr. Searcy Overcast Cardiologist - Dr. Dorn Lesches Urologists- Dr. Watt and Dr. Marcey Czech   PPM/ICD - denies   Chest x-ray - 5-10 yrs per pt EKG - 07/05/23 Stress Test - 10/31/18 ECHO - 03/07/18 Cardiac Cath - denies  Sleep Study - denies   DM- denies  Last dose of GLP1 agonist-  n/a   ASA/Blood Thinner Instructions: n/a   ERAS Protcol - no, NPO   COVID TEST- n/a   Anesthesia review: yes, cardiac hx, chronic fatigue syndrome (pt spoke to [place] at PAT appt)  Patient denies shortness of breath, fever, cough and chest pain at PAT appointment   All instructions explained to the patient, with a verbal understanding of the material. Patient agrees to go over the instructions while at home for a better understanding.  The opportunity to ask questions was provided.

## 2023-11-22 NOTE — Anesthesia Preprocedure Evaluation (Addendum)
 Anesthesia Evaluation  Patient identified by MRN, date of birth, ID band Patient awake    Reviewed: Allergy & Precautions, NPO status , Patient's Chart, lab work & pertinent test results  Airway Mallampati: II  TM Distance: >3 FB Neck ROM: Full    Dental  (+) Teeth Intact, Dental Advisory Given   Pulmonary neg pulmonary ROS   Pulmonary exam normal breath sounds clear to auscultation       Cardiovascular hypertension, (-) angina + Peripheral Vascular Disease (Thoracic aortic aneurysm)  (-) Past MI Normal cardiovascular exam Rhythm:Regular Rate:Normal     Neuro/Psych Seizures -, Well Controlled,  PSYCHIATRIC DISORDERS  Depression    Herniated lumbar disc without myelopathy   Neuromuscular disease    GI/Hepatic Neg liver ROS, hiatal hernia,GERD  ,,  Endo/Other  Hypothyroidism    Renal/GU Renal InsufficiencyRenal disease     Musculoskeletal  (+) Arthritis ,  Fibromyalgia -  Abdominal   Peds  Hematology negative hematology ROS (+)   Anesthesia Other Findings   Reproductive/Obstetrics                              Anesthesia Physical Anesthesia Plan  ASA: 3  Anesthesia Plan: General   Post-op Pain Management: Tylenol  PO (pre-op)*   Induction: Intravenous  PONV Risk Score and Plan: 3 and Dexamethasone , Ondansetron , Treatment may vary due to age or medical condition and Propofol  infusion  Airway Management Planned: Oral ETT  Additional Equipment:   Intra-op Plan:   Post-operative Plan: Extubation in OR  Informed Consent: I have reviewed the patients History and Physical, chart, labs and discussed the procedure including the risks, benefits and alternatives for the proposed anesthesia with the patient or authorized representative who has indicated his/her understanding and acceptance.     Dental advisory given  Plan Discussed with: CRNA  Anesthesia Plan Comments: (PAT note by  Lynwood Hope, PA-C: 73 year old female pertinent history including MDD/CFS, hypothyroidism, fibromyalgia, GERD, hiatal hernia, NPH s/p VP shunt, dilation of ascending thoracic aorta (stable 4.1 cm by CTA 06/2023).    Patient had prior cardiac testing with low risk nuclear stress and 10/2018 and echo 03/2018 showing LVEF 55 to 60%, normal RV, no significant valvular abnormalities.  Seen by cardiology 11/16/2023 for preop eval.  Per note by Miriam Shams, NP, According to the Revised Cardiac Risk Index (RCRI), her Perioperative Risk of Major Cardiac Event is (%): 0.4Her Functional Capacity in METs is: 3.97 according to the [facility] Activity Status Index (DASI). Prior to the onset of her debilitating back pain in August, patient was able to achieve a METS of 5.72. Suspect acute decrease of functional status today is more of a reflection of her physical limitations secondary to back pain, not cardiac function. Therefore, based on ACC/AHA guidelines, patient would be at acceptable risk for the planned procedure without further cardiovascular testing.  Patient is followed by Dr. Carlin Lehmann for history of MDD/CFS.  She brings perioperative recommendations that were provided by Dr. Lehmann.  These include, Ensure that serum magnesium  and potassium levels are adequate.  Hydrate the patient prior to surgery and postoperatively.  Use catecholamines, sympathomimetics, vasodilators, and hypotensive agents with caution.  Avoid histamine releasing anesthetic and muscle relaxing agents if possible.  Use sedating drugs sparingly.  Consider cortisol supplementation in patients who are chronically on steroid medications or who are seriously ill.  Joint hyperextensibility may be associated with apnea, TMJ subluxation, mast cell activation, reduced sensitivity of  local anesthesia, gastroparesis, dysautonomia and POTS, and are at higher risk for coagulation disorders, bleeding, tracheomalacia, and post puncture headaches.  Inquired  about her recent supplements, and advised patients to taper off/therapy as a least 1 week before surgery.  Copy of recommendations on patient's chart.  Patient states she did well when these recommendations were followed when she had general anesthesia at [facility] on 01/14/2015 for cysto/uretero/pyeloscopy with lithotripsy.  She also reports she had no complications when she more recently underwent right parietal ventriculoperitoneal shunt placement at [facility] on 06/28/2022.  However, she states she did not have a good experience at [facility] when she underwent cysto/uretero/pyeloscopy with lithotripsy on 01/12/2021 and relates this to the recommendations not being followed.  Preop labs reviewed, creatinine mildly elevated 1.16, calcium chronically elevated 11.4 (baseline ~11), otherwise unremarkable.  EKG 07/05/2023: Sinus rhythm with Premature atrial complexes.  Rate 90. Nonspecific ST and T wave abnormality  Nuclear stress 10/31/2018:  The left ventricular ejection fraction is normal (55-65%).  Nuclear stress EF: 64%.  Blood pressure demonstrated a normal response to exercise.  The study is normal.  This is a low risk study.   Normal resting and stress perfusion. No ischemia or infarction EF 64%  TTE 03/07/2018: 1. The left ventricle has normal systolic function, with an ejection  fraction of 55-60%. The cavity size was normal. Left ventricular diastolic  Doppler parameters are consistent with impaired relaxation.  2. The right ventricle has normal systolic function. The cavity was  normal. There is no increase in right ventricular wall thickness. Right  ventricular systolic pressure is normal with an estimated pressure of 24.9  mmHg.  3. The mitral valve is normal in structure.  4. The tricuspid valve is normal in structure.  5. The aortic valve is tricuspid Aortic valve regurgitation is mild by  color flow Doppler. The jet is centrally-directed.  6. The pulmonic valve was normal in  structure.  7. There is mild dilatation of the aortic root measuring 40 mm.    )         Anesthesia Quick Evaluation

## 2023-11-22 NOTE — Progress Notes (Addendum)
 Anesthesia Chart Review:  73 year old female pertinent history including MDD/CFS, hypothyroidism, fibromyalgia, GERD, hiatal hernia, NPH s/p VP shunt, dilation of ascending thoracic aorta (stable 4.1 cm by CTA 06/2023).    Patient had prior cardiac testing with low risk nuclear stress and 10/2018 and echo 03/2018 showing LVEF 55 to 60%, normal RV, no significant valvular abnormalities.  Seen by cardiology 11/16/2023 for preop eval.  Per note by Miriam Shams, NP, According to the Revised Cardiac Risk Index (RCRI), her Perioperative Risk of Major Cardiac Event is (%): 0.4Her Functional Capacity in METs is: 3.97 according to the [facility] Activity Status Index (DASI). Prior to the onset of her debilitating back pain in August, patient was able to achieve a METS of 5.72. Suspect acute decrease of functional status today is more of a reflection of her physical limitations secondary to back pain, not cardiac function. Therefore, based on ACC/AHA guidelines, patient would be at acceptable risk for the planned procedure without further cardiovascular testing.   Patient is followed by Dr. Carlin Lehmann for history of MDD/CFS.  She brings perioperative recommendations that were provided by Dr. Lehmann.  These include, Ensure that serum magnesium  and potassium levels are adequate.  Hydrate the patient prior to surgery and postoperatively.  Use catecholamines, sympathomimetics, vasodilators, and hypotensive agents with caution.  Avoid histamine releasing anesthetic and muscle relaxing agents if possible.  Use sedating drugs sparingly.  Consider cortisol supplementation in patients who are chronically on steroid medications or who are seriously ill.  Joint hyperextensibility may be associated with apnea, TMJ subluxation, mast cell activation, reduced sensitivity of local anesthesia, gastroparesis, dysautonomia and POTS, and are at higher risk for coagulation disorders, bleeding, tracheomalacia, and post puncture headaches.   Inquired about her recent supplements, and advised patients to taper off/therapy as a least 1 week before surgery.  Copy of recommendations on patient's chart.  Patient states she did well when these recommendations were followed when she had general anesthesia at [facility] on 01/14/2015 for cysto/uretero/pyeloscopy with lithotripsy.  She also reports she had no complications when she more recently underwent right parietal ventriculoperitoneal shunt placement at [facility] on 06/28/2022.  However, she states she did not have a good experience at [facility] when she underwent cysto/uretero/pyeloscopy with lithotripsy on 01/12/2021 and relates this to the recommendations not being followed.  Preop labs reviewed, creatinine mildly elevated 1.16, calcium chronically elevated 11.4 (baseline ~11), otherwise unremarkable.  EKG 07/05/2023: Sinus rhythm with Premature atrial complexes.  Rate 90. Nonspecific ST and T wave abnormality  Nuclear stress 10/31/2018: The left ventricular ejection fraction is normal (55-65%). Nuclear stress EF: 64%. Blood pressure demonstrated a normal response to exercise. The study is normal. This is a low risk study.   Normal resting and stress perfusion. No ischemia or infarction EF 64%  TTE 03/07/2018: 1. The left ventricle has normal systolic function, with an ejection  fraction of 55-60%. The cavity size was normal. Left ventricular diastolic  Doppler parameters are consistent with impaired relaxation.   2. The right ventricle has normal systolic function. The cavity was  normal. There is no increase in right ventricular wall thickness. Right  ventricular systolic pressure is normal with an estimated pressure of 24.9  mmHg.   3. The mitral valve is normal in structure.   4. The tricuspid valve is normal in structure.   5. The aortic valve is tricuspid Aortic valve regurgitation is mild by  color flow Doppler. The jet is centrally-directed.   6. The pulmonic valve was normal in  structure.   7. There is mild dilatation of the aortic root measuring 40 mm.      Lynwood Geofm RIGGERS [facility]/Anesthesiology Phone [phone] 11/22/2023 10:07 AM

## 2023-11-23 ENCOUNTER — Ambulatory Visit (HOSPITAL_COMMUNITY): Admission: RE | Disposition: A | Payer: Self-pay | Source: Home / Self Care | Attending: Neurosurgery

## 2023-11-23 ENCOUNTER — Ambulatory Visit (HOSPITAL_COMMUNITY)

## 2023-11-23 ENCOUNTER — Ambulatory Visit (HOSPITAL_COMMUNITY): Payer: Self-pay

## 2023-11-23 ENCOUNTER — Ambulatory Visit (HOSPITAL_COMMUNITY): Payer: Self-pay | Admitting: Physician Assistant

## 2023-11-23 ENCOUNTER — Ambulatory Visit (HOSPITAL_COMMUNITY)
Admission: RE | Admit: 2023-11-23 | Discharge: 2023-11-24 | Disposition: A | Discharge status: Home / Self Care | Attending: Neurosurgery | Admitting: Neurosurgery

## 2023-11-23 ENCOUNTER — Encounter (HOSPITAL_COMMUNITY): Payer: Self-pay | Admitting: Neurosurgery

## 2023-11-23 DIAGNOSIS — M5116 Intervertebral disc disorders with radiculopathy, lumbar region: Secondary | ICD-10-CM | POA: Diagnosis not present

## 2023-11-23 DIAGNOSIS — M48061 Spinal stenosis, lumbar region without neurogenic claudication: Secondary | ICD-10-CM

## 2023-11-23 DIAGNOSIS — E039 Hypothyroidism, unspecified: Secondary | ICD-10-CM | POA: Insufficient documentation

## 2023-11-23 DIAGNOSIS — M5126 Other intervertebral disc displacement, lumbar region: Secondary | ICD-10-CM | POA: Diagnosis present

## 2023-11-23 DIAGNOSIS — I10 Essential (primary) hypertension: Secondary | ICD-10-CM | POA: Insufficient documentation

## 2023-11-23 DIAGNOSIS — Z0189 Encounter for other specified special examinations: Secondary | ICD-10-CM | POA: Diagnosis not present

## 2023-11-23 DIAGNOSIS — E785 Hyperlipidemia, unspecified: Secondary | ICD-10-CM

## 2023-11-23 HISTORY — PX: LUMBAR LAMINECTOMY/DECOMPRESSION MICRODISCECTOMY: SHX5026

## 2023-11-23 SURGERY — LUMBAR LAMINECTOMY/DECOMPRESSION MICRODISCECTOMY 1 LEVEL
Anesthesia: General | Site: Back | Laterality: Bilateral

## 2023-11-23 MED ORDER — PROPOFOL 500 MG/50ML IV EMUL
INTRAVENOUS | Status: DC | PRN
  Administered 2023-11-23: 25 ug/kg/min via INTRAVENOUS

## 2023-11-23 MED ORDER — CYCLOBENZAPRINE HCL 10 MG PO TABS: 10.0000 mg | ORAL_TABLET | Freq: Three times a day (TID) | ORAL | Status: DC | PRN

## 2023-11-23 MED ORDER — BUPIVACAINE HCL (PF) 0.25 % IJ SOLN
INTRAMUSCULAR | Status: AC
  Filled 2023-11-23: qty 30

## 2023-11-23 MED ORDER — SIMETHICONE 80 MG PO CHEW
80.0000 mg | CHEWABLE_TABLET | Freq: Four times a day (QID) | ORAL | Status: DC | PRN
Start: 2023-11-23 — End: 2023-11-24

## 2023-11-23 MED ORDER — HYDROMORPHONE HCL 1 MG/ML IJ SOLN
0.5000 mg | INTRAMUSCULAR | Status: DC | PRN
  Administered 2023-11-23: 0.5 mg via INTRAVENOUS
  Filled 2023-11-23: qty 0.5

## 2023-11-23 MED ORDER — PANTOPRAZOLE SODIUM 40 MG IV SOLR: 40.0000 mg | Freq: Every day | INTRAVENOUS | Status: DC

## 2023-11-23 MED ORDER — ROCURONIUM BROMIDE 10 MG/ML (PF) SYRINGE
PREFILLED_SYRINGE | INTRAVENOUS | Status: AC
  Filled 2023-11-23: qty 10

## 2023-11-23 MED ORDER — ACETAMINOPHEN 650 MG RE SUPP: 650.0000 mg | RECTAL | Status: DC | PRN

## 2023-11-23 MED ORDER — MELATONIN 3 MG PO TABS
3.0000 mg | ORAL_TABLET | Freq: Every day | ORAL | Status: DC
  Administered 2023-11-23: 3 mg via ORAL
  Filled 2023-11-23: qty 1

## 2023-11-23 MED ORDER — CHLORHEXIDINE GLUCONATE CLOTH 2 % EX PADS: 6.0000 | MEDICATED_PAD | Freq: Once | CUTANEOUS | Status: DC

## 2023-11-23 MED ORDER — EPHEDRINE 5 MG/ML INJ
INTRAVENOUS | Status: AC
  Filled 2023-11-23: qty 5

## 2023-11-23 MED ORDER — DEXAMETHASONE SOD PHOSPHATE PF 10 MG/ML IJ SOLN
INTRAMUSCULAR | Status: DC | PRN
  Administered 2023-11-23: 10 mg via INTRAVENOUS

## 2023-11-23 MED ORDER — THROMBIN 5000 UNITS EX KIT
PACK | CUTANEOUS | Status: AC
  Filled 2023-11-23: qty 2

## 2023-11-23 MED ORDER — MENTHOL 3 MG MT LOZG: 1.0000 | LOZENGE | OROMUCOSAL | Status: DC | PRN

## 2023-11-23 MED ORDER — CLONAZEPAM 0.25 MG PO TBDP
0.7500 mg | ORAL_TABLET | Freq: Every day | ORAL | Status: DC
  Administered 2023-11-23: 0.75 mg via ORAL
  Filled 2023-11-23: qty 3

## 2023-11-23 MED ORDER — FOSFOMYCIN TROMETHAMINE 3 G PO PACK
3.0000 g | PACK | ORAL | Status: DC
  Filled 2023-11-23: qty 3

## 2023-11-23 MED ORDER — THROMBIN (RECOMBINANT) 5000 UNITS EX SOLR
CUTANEOUS | Status: DC | PRN
  Administered 2023-11-23: 10 mL via TOPICAL

## 2023-11-23 MED ORDER — BIOTIN 5000 MCG PO TABS: 5000.0000 ug | ORAL_TABLET | Freq: Every morning | ORAL | Status: DC

## 2023-11-23 MED ORDER — PHENYLEPHRINE 80 MCG/ML (10ML) SYRINGE FOR IV PUSH (FOR BLOOD PRESSURE SUPPORT)
PREFILLED_SYRINGE | INTRAVENOUS | Status: AC
  Filled 2023-11-23: qty 10

## 2023-11-23 MED ORDER — LACTATED RINGERS IV SOLN: INTRAVENOUS | Status: DC

## 2023-11-23 MED ORDER — LIDOCAINE 2% (20 MG/ML) 5 ML SYRINGE
INTRAMUSCULAR | Status: DC | PRN
  Administered 2023-11-23: 80 mg via INTRAVENOUS

## 2023-11-23 MED ORDER — TRAMADOL HCL 50 MG PO TABS
50.0000 mg | ORAL_TABLET | Freq: Four times a day (QID) | ORAL | Status: DC | PRN
  Administered 2023-11-23: 50 mg via ORAL
  Filled 2023-11-23: qty 1

## 2023-11-23 MED ORDER — PHENOL 1.4 % MT LIQD
1.0000 | OROMUCOSAL | Status: DC | PRN
Start: 2023-11-23 — End: 2023-11-24

## 2023-11-23 MED ORDER — 0.9 % SODIUM CHLORIDE (POUR BTL) OPTIME
TOPICAL | Status: DC | PRN
  Administered 2023-11-23: 1000 mL

## 2023-11-23 MED ORDER — OMEGA-3-ACID ETHYL ESTERS 1 G PO CAPS
1.0000 g | ORAL_CAPSULE | Freq: Every day | ORAL | Status: DC
  Filled 2023-11-23 (×2): qty 1

## 2023-11-23 MED ORDER — PROPOFOL 10 MG/ML IV BOLUS
INTRAVENOUS | Status: DC | PRN
  Administered 2023-11-23: 130 mg via INTRAVENOUS

## 2023-11-23 MED ORDER — FENTANYL CITRATE (PF) 250 MCG/5ML IJ SOLN
INTRAMUSCULAR | Status: AC
  Filled 2023-11-23: qty 5

## 2023-11-23 MED ORDER — LIDOCAINE-EPINEPHRINE 1 %-1:100000 IJ SOLN
INTRAMUSCULAR | Status: AC
  Filled 2023-11-23: qty 1

## 2023-11-23 MED ORDER — L-SERINE POWD: 500.0000 mg | Freq: Every day | Status: DC

## 2023-11-23 MED ORDER — PHENYLEPHRINE HCL-NACL 20-0.9 MG/250ML-% IV SOLN
INTRAVENOUS | Status: DC | PRN
Start: 2023-11-23 — End: 2023-11-23
  Administered 2023-11-23: 25 ug/min via INTRAVENOUS

## 2023-11-23 MED ORDER — ORAL CARE MOUTH RINSE: 15.0000 mL | Freq: Once | OROMUCOSAL | Status: AC

## 2023-11-23 MED ORDER — ACETAMINOPHEN 500 MG PO TABS: 500.0000 mg | ORAL_TABLET | Freq: Every day | ORAL | Status: DC

## 2023-11-23 MED ORDER — CHLORHEXIDINE GLUCONATE 0.12 % MT SOLN
15.0000 mL | Freq: Once | OROMUCOSAL | Status: AC
  Administered 2023-11-23: 15 mL via OROMUCOSAL
  Filled 2023-11-23: qty 15

## 2023-11-23 MED ORDER — ONDANSETRON HCL 4 MG/2ML IJ SOLN
INTRAMUSCULAR | Status: DC | PRN
  Administered 2023-11-23: 4 mg via INTRAVENOUS

## 2023-11-23 MED ORDER — SODIUM CHLORIDE 0.9% FLUSH: 3.0000 mL | INTRAVENOUS | Status: DC | PRN

## 2023-11-23 MED ORDER — FENTANYL CITRATE (PF) 100 MCG/2ML IJ SOLN
INTRAMUSCULAR | Status: AC
  Filled 2023-11-23: qty 2

## 2023-11-23 MED ORDER — FENTANYL CITRATE (PF) 100 MCG/2ML IJ SOLN
25.0000 ug | INTRAMUSCULAR | Status: DC | PRN
  Administered 2023-11-23: 50 ug via INTRAVENOUS
  Administered 2023-11-23 (×3): 25 ug via INTRAVENOUS

## 2023-11-23 MED ORDER — ACETAMINOPHEN 500 MG PO TABS
1000.0000 mg | ORAL_TABLET | Freq: Once | ORAL | Status: AC
  Administered 2023-11-23: 1000 mg via ORAL
  Filled 2023-11-23: qty 2

## 2023-11-23 MED ORDER — PROPOFOL 1000 MG/100ML IV EMUL
INTRAVENOUS | Status: AC
  Filled 2023-11-23: qty 100

## 2023-11-23 MED ORDER — ONDANSETRON HCL 4 MG/2ML IJ SOLN: 4.0000 mg | Freq: Four times a day (QID) | INTRAMUSCULAR | Status: DC | PRN

## 2023-11-23 MED ORDER — ONDANSETRON HCL 4 MG PO TABS: 4.0000 mg | ORAL_TABLET | Freq: Four times a day (QID) | ORAL | Status: DC | PRN

## 2023-11-23 MED ORDER — ACETAMINOPHEN 325 MG PO TABS: 650.0000 mg | ORAL_TABLET | ORAL | Status: DC | PRN

## 2023-11-23 MED ORDER — EVOLOCUMAB 140 MG/ML ~~LOC~~ SOAJ: 140.0000 mg | SUBCUTANEOUS | Status: DC

## 2023-11-23 MED ORDER — POTASSIUM CHLORIDE CRYS ER 20 MEQ PO TBCR
20.0000 meq | EXTENDED_RELEASE_TABLET | Freq: Two times a day (BID) | ORAL | Status: DC
  Administered 2023-11-23: 20 meq via ORAL
  Filled 2023-11-23 (×2): qty 1

## 2023-11-23 MED ORDER — HYDROCODONE-ACETAMINOPHEN 5-325 MG PO TABS
2.0000 | ORAL_TABLET | ORAL | Status: DC | PRN
  Administered 2023-11-23 – 2023-11-24 (×2): 2 via ORAL
  Filled 2023-11-23 (×3): qty 2

## 2023-11-23 MED ORDER — SUGAMMADEX SODIUM 200 MG/2ML IV SOLN
INTRAVENOUS | Status: DC | PRN
  Administered 2023-11-23: 250 mg via INTRAVENOUS

## 2023-11-23 MED ORDER — NAPROXEN 250 MG PO TABS
250.0000 mg | ORAL_TABLET | Freq: Three times a day (TID) | ORAL | Status: DC
  Filled 2023-11-23 (×3): qty 1

## 2023-11-23 MED ORDER — LEVOTHYROXINE SODIUM 100 MCG PO TABS
100.0000 ug | ORAL_TABLET | Freq: Every day | ORAL | Status: DC
  Administered 2023-11-23: 100 ug via ORAL
  Filled 2023-11-23: qty 1

## 2023-11-23 MED ORDER — MIDAZOLAM HCL 2 MG/2ML IJ SOLN
INTRAMUSCULAR | Status: AC
Start: 2023-11-23 — End: 2023-11-23
  Filled 2023-11-23: qty 2

## 2023-11-23 MED ORDER — VITAMIN D 25 MCG (1000 UNIT) PO TABS
1000.0000 [IU] | ORAL_TABLET | Freq: Every morning | ORAL | Status: DC
  Administered 2023-11-24: 1000 [IU] via ORAL
  Filled 2023-11-23: qty 1

## 2023-11-23 MED ORDER — SODIUM CHLORIDE 0.9% FLUSH
3.0000 mL | Freq: Two times a day (BID) | INTRAVENOUS | Status: DC
  Administered 2023-11-23 – 2023-11-24 (×2): 3 mL via INTRAVENOUS

## 2023-11-23 MED ORDER — BUPIVACAINE HCL (PF) 0.25 % IJ SOLN
INTRAMUSCULAR | Status: DC | PRN
  Administered 2023-11-23: 10 mL

## 2023-11-23 MED ORDER — ONDANSETRON HCL 4 MG/2ML IJ SOLN: 4.0000 mg | Freq: Once | INTRAMUSCULAR | Status: DC | PRN

## 2023-11-23 MED ORDER — PHENYLEPHRINE 80 MCG/ML (10ML) SYRINGE FOR IV PUSH (FOR BLOOD PRESSURE SUPPORT)
PREFILLED_SYRINGE | INTRAVENOUS | Status: DC | PRN
  Administered 2023-11-23: 80 ug via INTRAVENOUS
  Administered 2023-11-23 (×2): 160 ug via INTRAVENOUS

## 2023-11-23 MED ORDER — ESTRADIOL 0.01 % VA CREA
1.0000 | TOPICAL_CREAM | Freq: Every day | VAGINAL | Status: DC
  Administered 2023-11-23: 1 via VAGINAL
  Filled 2023-11-23: qty 42.5

## 2023-11-23 MED ORDER — CLONAZEPAM 0.5 MG PO TABS
0.5000 mg | ORAL_TABLET | Freq: Every day | ORAL | Status: DC
  Administered 2023-11-24: 0.5 mg via ORAL
  Filled 2023-11-23: qty 1

## 2023-11-23 MED ORDER — ALUM & MAG HYDROXIDE-SIMETH 200-200-20 MG/5ML PO SUSP
30.0000 mL | Freq: Four times a day (QID) | ORAL | Status: DC | PRN
Start: 2023-11-23 — End: 2023-11-24

## 2023-11-23 MED ORDER — CEFAZOLIN SODIUM-DEXTROSE 2-4 GM/100ML-% IV SOLN
2.0000 g | Freq: Three times a day (TID) | INTRAVENOUS | Status: AC
  Administered 2023-11-23 – 2023-11-24 (×2): 2 g via INTRAVENOUS
  Filled 2023-11-23 (×2): qty 100

## 2023-11-23 MED ORDER — CEFAZOLIN SODIUM-DEXTROSE 2-4 GM/100ML-% IV SOLN
2.0000 g | INTRAVENOUS | Status: AC
  Administered 2023-11-23: 2 g via INTRAVENOUS
  Filled 2023-11-23: qty 100

## 2023-11-23 MED ORDER — DIPHENHYDRAMINE HCL 25 MG PO CAPS
25.0000 mg | ORAL_CAPSULE | Freq: Every day | ORAL | Status: DC
  Administered 2023-11-23: 25 mg via ORAL
  Filled 2023-11-23: qty 1

## 2023-11-23 MED ORDER — PANTOPRAZOLE SODIUM 40 MG PO TBEC
40.0000 mg | DELAYED_RELEASE_TABLET | Freq: Every day | ORAL | Status: DC
  Administered 2023-11-23: 40 mg via ORAL
  Filled 2023-11-23: qty 1

## 2023-11-23 MED ORDER — FENTANYL CITRATE (PF) 250 MCG/5ML IJ SOLN
INTRAMUSCULAR | Status: DC | PRN
  Administered 2023-11-23 (×3): 50 ug via INTRAVENOUS

## 2023-11-23 MED ORDER — ONDANSETRON HCL 4 MG/2ML IJ SOLN
INTRAMUSCULAR | Status: AC
  Filled 2023-11-23: qty 2

## 2023-11-23 MED ORDER — FLUTICASONE PROPIONATE 50 MCG/ACT NA SUSP
1.0000 | Freq: Every day | NASAL | Status: DC
  Filled 2023-11-23: qty 16

## 2023-11-23 MED ORDER — ROCURONIUM BROMIDE 10 MG/ML (PF) SYRINGE
PREFILLED_SYRINGE | INTRAVENOUS | Status: DC | PRN
  Administered 2023-11-23: 30 mg via INTRAVENOUS
  Administered 2023-11-23: 50 mg via INTRAVENOUS

## 2023-11-23 MED ORDER — LIDOCAINE 2% (20 MG/ML) 5 ML SYRINGE
INTRAMUSCULAR | Status: AC
  Filled 2023-11-23: qty 5

## 2023-11-23 MED ORDER — DIPHENHYDRAMINE-APAP (SLEEP) 38-500 MG PO TABS: 1.0000 | ORAL_TABLET | Freq: Every day | ORAL | Status: DC

## 2023-11-23 MED ORDER — PROPOFOL 10 MG/ML IV BOLUS
INTRAVENOUS | Status: AC
  Filled 2023-11-23: qty 20

## 2023-11-23 MED ORDER — SODIUM CHLORIDE 0.9 % IV SOLN: 250.0000 mL | INTRAVENOUS | Status: DC

## 2023-11-23 SURGICAL SUPPLY — 42 items
BAG COUNTER SPONGE SURGICOUNT (BAG) ×1 IMPLANT
BAND RUBBER #18 3X1/16 STRL (MISCELLANEOUS) ×2 IMPLANT
BENZOIN TINCTURE PRP APPL 2/3 (GAUZE/BANDAGES/DRESSINGS) ×1 IMPLANT
BLADE CLIPPER SURG (BLADE) IMPLANT
BLADE SURG 11 STRL SS (BLADE) ×1 IMPLANT
BUR CUTTER 7.0 ROUND (BURR) ×1 IMPLANT
BUR MATCHSTICK NEURO 3.0 LAGG (BURR) ×1 IMPLANT
CANISTER SUCTION 3000ML PPV (SUCTIONS) ×1 IMPLANT
DERMABOND ADVANCED .7 DNX12 (GAUZE/BANDAGES/DRESSINGS) ×1 IMPLANT
DRAPE HALF SHEET 40X57 (DRAPES) IMPLANT
DRAPE LAPAROTOMY 100X72X124 (DRAPES) ×1 IMPLANT
DRAPE MICROSCOPE LEICA (MISCELLANEOUS) ×1 IMPLANT
DRAPE SURG 17X23 STRL (DRAPES) ×1 IMPLANT
DRSG OPSITE POSTOP 4X6 (GAUZE/BANDAGES/DRESSINGS) IMPLANT
DURAPREP 26ML APPLICATOR (WOUND CARE) ×1 IMPLANT
ELECTRODE REM PT RTRN 9FT ADLT (ELECTROSURGICAL) ×1 IMPLANT
GAUZE 4X4 16PLY ~~LOC~~+RFID DBL (SPONGE) IMPLANT
GAUZE SPONGE 4X4 12PLY STRL (GAUZE/BANDAGES/DRESSINGS) ×1 IMPLANT
GLOVE BIO SURGEON STRL SZ7 (GLOVE) IMPLANT
GLOVE BIO SURGEON STRL SZ8 (GLOVE) ×1 IMPLANT
GLOVE BIOGEL PI IND STRL 7.0 (GLOVE) IMPLANT
GLOVE INDICATOR 8.5 STRL (GLOVE) ×2 IMPLANT
GOWN STRL REUS W/ TWL LRG LVL3 (GOWN DISPOSABLE) ×1 IMPLANT
GOWN STRL REUS W/ TWL XL LVL3 (GOWN DISPOSABLE) ×2 IMPLANT
GOWN STRL REUS W/TWL 2XL LVL3 (GOWN DISPOSABLE) IMPLANT
KIT BASIN OR (CUSTOM PROCEDURE TRAY) ×1 IMPLANT
KIT TURNOVER KIT B (KITS) ×1 IMPLANT
NDL HYPO 22X1.5 SAFETY MO (MISCELLANEOUS) ×1 IMPLANT
NDL SPNL 22GX3.5 QUINCKE BK (NEEDLE) ×1 IMPLANT
NEEDLE HYPO 22X1.5 SAFETY MO (MISCELLANEOUS) ×1 IMPLANT
NEEDLE SPNL 22GX3.5 QUINCKE BK (NEEDLE) ×1 IMPLANT
PACK LAMINECTOMY NEURO (CUSTOM PROCEDURE TRAY) ×1 IMPLANT
SOLN 0.9% NACL POUR BTL 1000ML (IV SOLUTION) ×1 IMPLANT
SOLN STERILE WATER BTL 1000 ML (IV SOLUTION) ×1 IMPLANT
SPIKE FLUID TRANSFER (MISCELLANEOUS) ×1 IMPLANT
SPONGE SURGIFOAM ABS GEL SZ50 (HEMOSTASIS) ×1 IMPLANT
STRIP CLOSURE SKIN 1/2X4 (GAUZE/BANDAGES/DRESSINGS) ×1 IMPLANT
SUT VIC AB 0 CT1 18XCR BRD8 (SUTURE) ×1 IMPLANT
SUT VIC AB 2-0 CT1 18 (SUTURE) ×1 IMPLANT
SUT VIC AB 4-0 PS2 27 (SUTURE) ×1 IMPLANT
TOWEL GREEN STERILE (TOWEL DISPOSABLE) ×1 IMPLANT
TOWEL GREEN STERILE FF (TOWEL DISPOSABLE) ×1 IMPLANT

## 2023-11-23 NOTE — Transfer of Care (Signed)
 Immediate Anesthesia Transfer of Care Note  Patient: Jean Scott  Procedure(s) Performed: LUMBAR LAMINECTOMY/DECOMPRESSION MICRODISCECTOMY BILATERAL LUMBAR FOUR-LUMBAR FIVE (Bilateral: Back)  Patient Location: PACU  Anesthesia Type:General  Level of Consciousness: drowsy  Airway & Oxygen Therapy: Patient Spontanous Breathing and Patient connected to face mask oxygen  Post-op Assessment: Report given to RN and Post -op Vital signs reviewed and stable  Post vital signs: Reviewed and stable  Last Vitals:  Vitals Value Taken Time  BP 124/75 11/23/23 10:41  Temp 36.5 C 11/23/23 10:41  Pulse 69 11/23/23 10:44  Resp 17 11/23/23 10:44  SpO2 100 % 11/23/23 10:44  Vitals shown include unfiled device data.  Last Pain:  Vitals:   11/23/23 0721  TempSrc:   PainSc: 3          Complications: No notable events documented.

## 2023-11-23 NOTE — Op Note (Signed)
 Preoperative diagnosis: Lumbar spinal stenosis L4-5 herniated nucleus pulposus L4-5 left greater than right L5 radiculopathies.  Postoperative diagnosis: Same.  Procedure: Bilateral decompressive lumbar laminectomy L4-5 with partial facetectomy on the left at L4-5 microscopic assistance for discectomy and foraminotomies on the left at L4-5.  Surgeon: Arley helling.  Assistant: Suzen Click.  Anesthesia: General.  EBL: Minimal.  HPI: 73 year old female progressive worsening back and bilateral leg pain worse on the left workup revealed severe stenosis at L4-5 with a disc herniation and free fragment on the left at L4-5.  Due to the patient's progression of clinical syndrome imaging findings failed conservative treatment I recommended decompressive laminectomy bilaterally with discectomy from the left I extensively reviewed the risks and benefits of the operation with the patient as well as perioperative course expectations of outcome and alternatives to surgery and she understood and agreed to proceed forward.  Operative procedure: Patient was brought into the OR was induced under general anesthesia positioned prone the Wilson frame her back was prepped and draped in routine sterile fashion.  Preoperative x-ray localized the appropriate level so after this I midline incision was made Bovie light cautery was used to take down the subcutaneous tissue and subperiosteal dissection was carried lamina of L4 and L5 bilaterally.  Interoperative x-ray confirmed application appropriate level so the spinous process at L4 was removed as well as superior aspect spinous process at L5 lamina was drilled down and thinned out central decompression was begun and then I drilled out the medial facet complex on the left little more aggressively on the right I stayed away from the facet joint but drilled the lamina then under bit both gutters and then prepped and draped the operating microscope brought the operative  microscope in the field and from the left extended the medial facetectomy and then foraminotomies of the L5 nerve root extending superiorly identified the takeoff of the L4 nerve root identified the disc base there was a very large discrimination still partially contained with the ligament ligament was incised disc base was incised several large free fragments were removed from superior to the disc base underneath the thecal sac displacing the undersurface of the proximal L4 nerve root.  This was all teased away with a nerve hook and coronary dilator worked under the plane of the thecal sac and nerve root pushing the ligament from and freeing up many additional fragments from central thecal sac as well as laterally then into the disc base cleaned of the disc base from the left.  Then I under Bitton a little bit of the residual lamina on the patient's right side and up to sublaminar decompress that area under the microscope and used a coronary dilator to pass it along the foramina of L4 and L5 to confirm decompression and palpated the space to confirm no significant fragment was presenting on the right.  Then the wound was copiously irrigated meticulous hemostasis was maintained Gelfoam was over in top of the dura and the muscle fascia approximate layers with interrupted Vicryl skin was closed with a running 4 subcuticular.  Dermabond benzoin Steri-Strips and a sterile dressing was applied patient to cover room in stable addition.  At the end the case all needle count sponge counts were correct.

## 2023-11-23 NOTE — Anesthesia Procedure Notes (Signed)
 Procedure Name: Intubation Date/Time: 11/23/2023 8:41 AM  Performed by: Elby Raelene SAUNDERS, CRNAPre-anesthesia Checklist: Patient identified, Emergency Drugs available, Suction available and Patient being monitored Patient Re-evaluated:Patient Re-evaluated prior to induction Oxygen Delivery Method: Circle System Utilized Preoxygenation: Pre-oxygenation with 100% oxygen Induction Type: IV induction Ventilation: Mask ventilation without difficulty Laryngoscope Size: Miller and 2 Grade View: Grade I Tube type: Oral Tube size: 7.0 mm Number of attempts: 1 Airway Equipment and Method: Stylet and Bite block Placement Confirmation: ETT inserted through vocal cords under direct vision, positive ETCO2 and breath sounds checked- equal and bilateral Secured at: 22 cm Tube secured with: Tape Dental Injury: Teeth and Oropharynx as per pre-operative assessment

## 2023-11-23 NOTE — H&P (Signed)
 Jean Scott is an 73 y.o. female.   Chief Complaint: Back left greater than right leg pain HPI: 73 year old female progressive worsening back and left greater than right leg pain workup revealed large disc herniation with a free fragment at L4-5 primarily on the left but causing severe stenosis bilaterally.  She has primarily hip and leg pain and failed all forms of conservative treatment so I recommended decompressive laminectomy bilaterally with a microdiscectomy from the left at L4-5 I extensively gone over the risks and benefits of the operation with the patient as well as perioperative course expectations of outcome and alternatives to surgery and she understood and agreed to proceed forward.  Past Medical History:  Diagnosis Date   Allergic rhinitis    Dust mites, mold, plant polens   Anemia 1970's   Arm fracture 1957   Arthritis    back   Depression 1991   pt denies current depression   Elevated uric acid in blood    Fibroids    Fibromyalgia 1990   Fibrosis, breast    Mammogram   GERD (gastroesophageal reflux disease)    Glucose intolerance (impaired glucose tolerance)    Grand mal seizure (HCC) 1960   Hemorrhoids, internal 10/12/2005   History of hiatal hernia    History of hypercalcemia    History of kidney stones    Hyperlipidemia    Hypothyroidism 1990   Osteopenia 2012   Bone density Test   Seizures (HCC)    as a child, none as an adult   Thoracic aortic aneurysm    Urinary tract infection     Past Surgical History:  Procedure Laterality Date   ABDOMINAL HYSTERECTOMY  1989   open surgery for severe endometriosis but did initially have laproscopic   cataracts bilat     10/24/14 10/31/2014   CYSTOSCOPY     stent removal-[facility]-Jan 25,2017   Laparotomy  1987   [facility]  10/12/2021   at [facility] for kidney stones   URETEROSCOPY     Jan 10,2017   VENTRICULOPERITONEAL SHUNT Right 06/28/2022   Procedure: Right - Parietal ventriculoperitoneal shunt placement;   Surgeon: Onetha Kuba, MD;  Location: [facility] OR;  Service: Neurosurgery;  Laterality: Right;    Family History  Problem Relation Age of Onset   Parkinson's disease Father        Deceased at 21   Hypertension Mother    Osteoporosis Mother    Breast cancer Sister    Arthritis Brother    Kidney Stones Brother    Gout Brother    Pancreatic cancer Paternal Aunt    Colon cancer Neg Hx    Colon polyps Neg Hx    Social History:  reports that she has never smoked. She has never used smokeless tobacco. She reports that she does not drink alcohol  and does not use drugs.  Allergies:  Allergies  Allergen Reactions   Cefdinir Diarrhea   Sulfamethoxazole-Trimethoprim Other (See Comments)    Anxiety, jittery   Alprazolam Other (See Comments)    Get too groggy can't function   Chlorthalidone     Constipation    Ciprofloxacin     Pt gets dehydrated   Epinephrine      Makes pt feel like they are coming out of their skin   Potassium Citrate      Constipation; CSF crash so I wasn't functioning well at all   Metronidazole  Rash    Medications Prior to Admission  Medication Sig Dispense Refill   amphetamine -dextroamphetamine  (ADDERALL) 20  MG tablet Take 20 mg in the morning and 10 mg at lunch     anti-nausea (EMETROL) solution Take 10 mLs by mouth 2 (two) times daily. (Patient taking differently: Take 10 mLs by mouth 3 (three) times daily as needed for vomiting or nausea.) 118 mL 0   bacitracin  ointment Apply 1 Application topically 2 (two) times daily as needed (cuts/scrapes).     Biotin  5000 MCG TABS Take 5,000 mcg by mouth in the morning.     cholecalciferol  (VITAMIN D3) 25 MCG (1000 UNIT) tablet Take 1,000 Units by mouth in the morning.     clindamycin (CLINDAGEL) 1 % gel Apply 1 Application topically 2 (two) times daily as needed (skin irritation.).     clonazePAM  (KLONOPIN ) 0.5 MG tablet Take 0.25-0.75 mg by mouth See admin instructions. Take 0.5 tablet (0.25 mg) by mouth every morning & take  1.5 tablets (0.75 mg) by mouth at bedtime.     diphenhydrAMINE -APAP, sleep, (EXCEDRIN PM) 38-500 MG TABS Take 1 tablet by mouth at bedtime.     estradiol  (ESTRACE ) 0.1 MG/GM vaginal cream Place 1 Applicatorful vaginally at bedtime. Apply a pea sized amount nightly     Evolocumab  (REPATHA  SURECLICK) 140 MG/ML SOAJ Inject 140 mg into the skin every 14 (fourteen) days. 6 mL 3   fluticasone  (FLONASE ) 50 MCG/ACT nasal spray Place 1 spray into both nostrils in the morning and at bedtime.     fosfomycin (MONUROL ) 3 g PACK Take 3 g by mouth See admin instructions. Take 1 packet (3 g) by mouth every 10 days     L-Serine  POWD Take 500 mg by mouth at bedtime.     levothyroxine  (SYNTHROID ) 100 MCG tablet Take 100 mcg by mouth at bedtime.     Melatonin 2.5 MG CAPS Place 2.5 mg under the tongue at bedtime.     meloxicam (MOBIC) 15 MG tablet Take 15 mg by mouth in the morning.     mometasone  (ELOCON ) 0.1 % cream Apply 1 Application topically daily as needed (skin break-out).  2   Omega-3 Fatty Acids (FISH OIL) 1200 MG CAPS Take 2 capsules by mouth daily. 350 omega 3     potassium bicarbonate (KLOR-CON /EF) 25 MEQ disintegrating tablet Take 25 mEq by mouth 2 (two) times daily.     simethicone  (MYLICON) 80 MG chewable tablet Chew 80 mg by mouth every 6 (six) hours as needed for flatulence (Pain).     traMADol  (ULTRAM ) 50 MG tablet Take 50 mg by mouth in the morning and at bedtime.      Results for orders placed or performed during the hospital encounter of 11/21/23 (from the past 48 hours)  Basic metabolic panel per protocol     Status: Abnormal   Collection Time: 11/21/23  3:00 PM  Result Value Ref Range   Sodium 142 135 - 145 mmol/L   Potassium 4.6 3.5 - 5.1 mmol/L   Chloride 105 98 - 111 mmol/L   CO2 25 22 - 32 mmol/L   Glucose, Bld 123 (H) 70 - 99 mg/dL    Comment: Glucose reference range applies only to samples taken after fasting for at least 8 hours.   BUN 19 8 - 23 mg/dL   Creatinine, Ser 8.83 (H)  0.44 - 1.00 mg/dL   Calcium 88.5 (H) 8.9 - 10.3 mg/dL   GFR, Estimated 50 (L) >60 mL/min    Comment: (NOTE) Calculated using the CKD-EPI Creatinine Equation (2021)    Anion gap 12 5 - 15  Comment: Performed at [facility], [address] [place], [place] [address]  CBC per protocol     Status: Abnormal   Collection Time: 11/21/23  3:00 PM  Result Value Ref Range   WBC 10.4 4.0 - 10.5 K/uL   RBC 5.58 (H) 3.87 - 5.11 MIL/uL   Hemoglobin 15.7 (H) 12.0 - 15.0 g/dL   HCT 50.5 (H) 63.9 - 53.9 %   MCV 88.5 80.0 - 100.0 fL   MCH 28.1 26.0 - 34.0 pg   MCHC 31.8 30.0 - 36.0 g/dL   RDW 86.3 88.4 - 84.4 %   Platelets 366 150 - 400 K/uL   nRBC 0.0 0.0 - 0.2 %    Comment: Performed at [facility], [address] [place], [place] [address]  Surgical pcr screen     Status: None   Collection Time: 11/21/23  4:43 PM   Specimen: Nasal Mucosa; Nasal Swab  Result Value Ref Range   MRSA, PCR NEGATIVE NEGATIVE   Staphylococcus aureus NEGATIVE NEGATIVE    Comment: (NOTE) The Xpert SA Assay (FDA approved for NASAL specimens in patients 54 years of age and older), is one component of a comprehensive surveillance program. It is not intended to diagnose infection nor to guide or monitor treatment. Performed at [facility], [address], [place], [place] [address]    No results found.  Review of Systems  Musculoskeletal:  Positive for back pain.  Neurological:  Positive for numbness.    Blood pressure (!) 148/86, pulse 88, temperature 98.7 F (37.1 C), temperature source Oral, resp. rate 18, height 5' 4 (1.626 m), weight 70.8 kg, SpO2 99%. Physical Exam HENT:     Head: Normocephalic.     Right Ear: Tympanic membrane normal.     Nose: Nose normal.  Cardiovascular:     Rate and Rhythm: Normal rate.     Pulses: Normal pulses.  Pulmonary:     Effort: Pulmonary effort is normal.  Musculoskeletal:     Cervical back: Normal range of motion.  Neurological:     Mental  Status: She is alert.     Comments: Strength is 5 out of 5 iliopsoas, quads, hamstrings, gastroc, tibialis, EHL.      Assessment/Plan 73 year old presents for decompressive laminectomy left-sided microdiscectomy L4-5  Arley SHAUNNA Helling, MD 11/23/2023, 8:20 AM

## 2023-11-23 NOTE — Anesthesia Postprocedure Evaluation (Signed)
 Anesthesia Post Note  Patient: Jean Scott  Procedure(s) Performed: LUMBAR LAMINECTOMY/DECOMPRESSION MICRODISCECTOMY BILATERAL LUMBAR FOUR-LUMBAR FIVE (Bilateral: Back)     Patient location during evaluation: Nursing Unit Anesthesia Type: General Level of consciousness: awake and alert Pain management: pain level controlled Vital Signs Assessment: post-procedure vital signs reviewed and stable Respiratory status: spontaneous breathing, nonlabored ventilation and respiratory function stable Cardiovascular status: blood pressure returned to baseline and stable Postop Assessment: no apparent nausea or vomiting Anesthetic complications: no   No notable events documented.  Last Vitals:  Vitals:   11/23/23 1620 11/23/23 1935  BP: 119/67 122/67  Pulse: 85 98  Resp: 17 18  Temp: 36.8 C 37.2 C  SpO2: 93% 94%    Last Pain:  Vitals:   11/23/23 1935  TempSrc: Oral  PainSc:                  [name]

## 2023-11-24 ENCOUNTER — Encounter (HOSPITAL_COMMUNITY): Payer: Self-pay | Admitting: Neurosurgery

## 2023-11-24 DIAGNOSIS — M5116 Intervertebral disc disorders with radiculopathy, lumbar region: Secondary | ICD-10-CM | POA: Diagnosis not present

## 2023-11-24 DIAGNOSIS — I10 Essential (primary) hypertension: Secondary | ICD-10-CM | POA: Diagnosis not present

## 2023-11-24 DIAGNOSIS — E039 Hypothyroidism, unspecified: Secondary | ICD-10-CM | POA: Diagnosis not present

## 2023-11-24 DIAGNOSIS — M48061 Spinal stenosis, lumbar region without neurogenic claudication: Secondary | ICD-10-CM | POA: Diagnosis not present

## 2023-11-24 MED ORDER — HYDROCODONE-ACETAMINOPHEN 5-325 MG PO TABS: 1.0000 | ORAL_TABLET | Freq: Four times a day (QID) | ORAL | 0 refills | Status: AC | PRN

## 2023-11-24 MED ORDER — CYCLOBENZAPRINE HCL 10 MG PO TABS: 10.0000 mg | ORAL_TABLET | Freq: Three times a day (TID) | ORAL | 0 refills | Status: AC | PRN

## 2023-11-24 MED FILL — Thrombin For Soln 5000 Unit: CUTANEOUS | Qty: 2 | Status: AC

## 2023-11-24 NOTE — Discharge Summary (Signed)
 Physician Discharge Summary  Patient ID: Jean Scott MRN: [id number] DOB/AGE: 03-28-1950 73 y.o.  Admit date: 11/23/2023 Discharge date: 11/24/2023  Admission Diagnoses: Lumbar spinal stenosis L4-5 herniated nucleus pulposus L4-5 left greater than right L5 radiculopathies.     Discharge Diagnoses: same   Discharged Condition: good  Hospital Course: The patient was admitted on 11/23/2023 and taken to the operating room where the patient underwent lumbar laminectomy L4-5. The patient tolerated the procedure well and was taken to the recovery room and then to the floor in stable condition. The hospital course was routine. There were no complications. The wound remained clean dry and intact. Pt had appropriate back soreness. No complaints of leg pain or new N/T/W. The patient remained afebrile with stable vital signs, and tolerated a regular diet. The patient continued to increase activities, and pain was well controlled with oral pain medications.   Consults: None  Significant Diagnostic Studies:  Results for orders placed or performed during the hospital encounter of 11/21/23  Basic metabolic panel per protocol   Collection Time: 11/21/23  3:00 PM  Result Value Ref Range   Sodium 142 135 - 145 mmol/L   Potassium 4.6 3.5 - 5.1 mmol/L   Chloride 105 98 - 111 mmol/L   CO2 25 22 - 32 mmol/L   Glucose, Bld 123 (H) 70 - 99 mg/dL   BUN 19 8 - 23 mg/dL   Creatinine, Ser 8.83 (H) 0.44 - 1.00 mg/dL   Calcium 88.5 (H) 8.9 - 10.3 mg/dL   GFR, Estimated 50 (L) >60 mL/min   Anion gap 12 5 - 15  CBC per protocol   Collection Time: 11/21/23  3:00 PM  Result Value Ref Range   WBC 10.4 4.0 - 10.5 K/uL   RBC 5.58 (H) 3.87 - 5.11 MIL/uL   Hemoglobin 15.7 (H) 12.0 - 15.0 g/dL   HCT 50.5 (H) 63.9 - 53.9 %   MCV 88.5 80.0 - 100.0 fL   MCH 28.1 26.0 - 34.0 pg   MCHC 31.8 30.0 - 36.0 g/dL   RDW 86.3 88.4 - 84.4 %   Platelets 366 150 - 400 K/uL   nRBC 0.0 0.0 - 0.2 %  Surgical pcr screen    Collection Time: 11/21/23  4:43 PM   Specimen: Nasal Mucosa; Nasal Swab  Result Value Ref Range   MRSA, PCR NEGATIVE NEGATIVE   Staphylococcus aureus NEGATIVE NEGATIVE    DG Lumbar Spine 2-3 Views Result Date: 11/23/2023 EXAM: 2 LATERAL PORTABLE RADIOGRAPHS OBTAINED IN THE OPERATING ROOM XRAY OF THE LUMBAR SPINE 11/23/2023 11:03:50 AM COMPARISON: Lumbar spine MRI 10/15/2023. CLINICAL HISTORY: 461500 Elective surgery [phone] FINDINGS: LUMBAR SPINE: BONES: On the first image, there is a surgical probe posterior to the L5 spinous process. On the second image, there is a surgical probe posterior to the L4-L5 facet joint. DISCS AND DEGENERATIVE CHANGES: . On the second image, there are tissue spreaders posterior to the L4-L5 disc space. SOFT TISSUES: No acute abnormality. IMPRESSION: 1. Intraoperative localization at L4-L5 disc space with surgical instruments visualized posterior to the L4-L5 disc space and facet joint on the second image. Electronically signed by: Waddell Calk MD 11/23/2023 02:08 PM EST RP Workstation: [id number]    Antibiotics:  Anti-infectives (From admission, onward)    Start     Dose/Rate Route Frequency Ordered Stop   11/23/23 1800  fosfomycin (MONUROL) packet 3 g       Note to Pharmacy: Take 1 packet (3 g) by mouth every 10 days  3 g Oral Once every 10 days 11/23/23 1328     11/23/23 1700  ceFAZolin  (ANCEF ) IVPB 2g/100 mL premix        2 g 200 mL/hr over 30 Minutes Intravenous Every 8 hours 11/23/23 1328 11/24/23 0031   11/23/23 0700  ceFAZolin  (ANCEF ) IVPB 2g/100 mL premix        2 g 200 mL/hr over 30 Minutes Intravenous On call to O.R. 11/23/23 0658 11/23/23 0915       Discharge Exam: Blood pressure (!) 158/79, pulse 91, temperature 97.7 F (36.5 C), temperature source Oral, resp. rate 18, height 5' 4 (1.626 m), weight 70.8 kg, SpO2 99%. Neurologic: Grossly normal Ambulating and voiding well incision cdi   Discharge Medications:   Allergies as of  11/24/2023       Reactions   Cefdinir Diarrhea   Sulfamethoxazole-trimethoprim Other (See Comments)   Anxiety, jittery   Alprazolam Other (See Comments)   Get too groggy can't function   Chlorthalidone    Constipation   Ciprofloxacin    Pt gets dehydrated   Epinephrine     Makes pt feel like they are coming out of their skin   Potassium Citrate     Constipation; CSF crash so I wasn't functioning well at all   Metronidazole  Rash        Medication List     STOP taking these medications    traMADol  50 MG tablet Commonly known as: ULTRAM        TAKE these medications    amphetamine -dextroamphetamine  20 MG tablet Commonly known as: Adderall Take 20 mg in the morning and 10 mg at lunch   anti-nausea solution Take 10 mLs by mouth 2 (two) times daily. What changed:  when to take this reasons to take this   bacitracin  ointment Apply 1 Application topically 2 (two) times daily as needed (cuts/scrapes).   Biotin  5000 MCG Tabs Take 5,000 mcg by mouth in the morning.   cholecalciferol  25 MCG (1000 UNIT) tablet Commonly known as: VITAMIN D3 Take 1,000 Units by mouth in the morning.   clindamycin 1 % gel Commonly known as: CLINDAGEL Apply 1 Application topically 2 (two) times daily as needed (skin irritation.).   clonazePAM  0.5 MG tablet Commonly known as: KLONOPIN  Take 0.25-0.75 mg by mouth See admin instructions. Take 0.5 tablet (0.25 mg) by mouth every morning & take 1.5 tablets (0.75 mg) by mouth at bedtime.   cyclobenzaprine  10 MG tablet Commonly known as: FLEXERIL  Take 1 tablet (10 mg total) by mouth 3 (three) times daily as needed for muscle spasms.   estradiol  0.1 MG/GM vaginal cream Commonly known as: ESTRACE  Place 1 Applicatorful vaginally at bedtime. Apply a pea sized amount nightly   Excedrin PM 500-38 MG Tabs Generic drug: diphenhydrAMINE -APAP (sleep) Take 1 tablet by mouth at bedtime.   Fish Oil 1200 MG Caps Take 2 capsules by mouth daily. 350  omega 3   fluticasone  50 MCG/ACT nasal spray Commonly known as: FLONASE  Place 1 spray into both nostrils in the morning and at bedtime.   fosfomycin 3 g Pack Commonly known as: MONUROL  Take 3 g by mouth See admin instructions. Take 1 packet (3 g) by mouth every 10 days   HYDROcodone -acetaminophen  5-325 MG tablet Commonly known as: NORCO/VICODIN Take 1 tablet by mouth every 6 (six) hours as needed for severe pain (pain score 7-10).   Klor-Con /EF 25 MEQ disintegrating tablet Generic drug: potassium bicarbonate Take 25 mEq by mouth 2 (two) times daily.   L-Serine  Powd  Take 500 mg by mouth at bedtime.   levothyroxine  100 MCG tablet Commonly known as: SYNTHROID  Take 100 mcg by mouth at bedtime.   Melatonin 2.5 MG Caps Place 2.5 mg under the tongue at bedtime.   meloxicam 15 MG tablet Commonly known as: MOBIC Take 15 mg by mouth in the morning.   mometasone  0.1 % cream Commonly known as: ELOCON  Apply 1 Application topically daily as needed (skin break-out).   Repatha  SureClick 140 MG/ML Soaj Generic drug: Evolocumab  Inject 140 mg into the skin every 14 (fourteen) days.   simethicone  80 MG chewable tablet Commonly known as: MYLICON Chew 80 mg by mouth every 6 (six) hours as needed for flatulence (Pain).        Disposition: home   Final Dx: lumbar laminectomy L4-5  Discharge Instructions      Remove dressing in 72 hours   Complete by: As directed    Call MD for:   Complete by: As directed    Call MD for:  difficulty breathing, headache or visual disturbances   Complete by: As directed    Call MD for:  hives   Complete by: As directed    Call MD for:  persistant dizziness or light-headedness   Complete by: As directed    Call MD for:  persistant nausea and vomiting   Complete by: As directed    Call MD for:  redness, tenderness, or signs of infection (pain, swelling, redness, odor or green/yellow discharge around incision site)   Complete by: As directed     Call MD for:  severe uncontrolled pain   Complete by: As directed    Call MD for:  temperature >100.4   Complete by: As directed    Diet - low sodium heart healthy   Complete by: As directed    Driving Restrictions   Complete by: As directed    No driving for 2 weeks, no riding in the car for 1 week   Increase activity slowly   Complete by: As directed    Lifting restrictions   Complete by: As directed    No lifting more than 8 lbs          Signed: Suzen Lacks Raneen Jaffer 11/24/2023, 7:58 AM

## 2023-11-24 NOTE — Progress Notes (Signed)
 Patient alert and oriented, mae's well, voiding adequate amount of urine, swallowing without difficulty, no c/o pain at time of discharge. Patient discharged home with family. Script and discharged instructions given to patient. Patient and family stated understanding of instructions given. Patient has an appointment with Dr. Onetha In 2 weeks. Patient waiting for family for ride home

## 2023-11-24 NOTE — Evaluation (Signed)
 Physical Therapy Evaluation Patient Details Name: Jean Scott MRN: [id number] DOB: March 15, 1950 Today's Date: 11/24/2023  History of Present Illness  Jean Scott is an 73 y.o. female who is s/p  lumbar laminectomy L4-5 11/19. She has PMH fibromyalgia, depression, GERD, hemorrhoids, hypothyroid, and arthritis.  Clinical Impression  Patient presents with decreased mobility due to pain and generalized LE weakness post back surgery.  Currently S to CGA for mobility with RW.  Educated on back precautions and following with mobility and during bathroom trip.  Spouse present and educated how to assist on steps if accessing second floor of home.  Also reviewed technique for car transfer.  Patient declined HHPT and likely will progress with spouse assist.  PT signing off as planned home today.  Will need RW at d/c.        If plan is discharge home, recommend the following: A little help with walking and/or transfers;Help with stairs or ramp for entrance;A little help with bathing/dressing/bathroom   Can travel by private vehicle        Equipment Recommendations Rolling walker (2 wheels)  Recommendations for Other Services       Functional Status Assessment Patient has had a recent decline in their functional status and demonstrates the ability to make significant improvements in function in a reasonable and predictable amount of time.     Precautions / Restrictions Precautions Precautions: Fall;Back Precaution Booklet Issued: Yes (comment) Recall of Precautions/Restrictions: Intact Precaution/Restrictions Comments: no brace needed      Mobility  Bed Mobility Overal bed mobility: Needs Assistance Bed Mobility: Rolling, Sidelying to Sit Rolling: Supervision Sidelying to sit: Supervision       General bed mobility comments: used rails, educated to use walker at the bedside if needs rail    Transfers Overall transfer level: Needs assistance Equipment used: Rolling walker (2  wheels) Transfers: Sit to/from Stand Sit to Stand: Supervision, Contact guard assist           General transfer comment: from EOB cues for anterior weight shift and hand placement, in bathroom stood on her own    Ambulation/Gait Ambulation/Gait assistance: Supervision Gait Distance (Feet): 200 Feet Assistive device: Rolling walker (2 wheels) Gait Pattern/deviations: Step-through pattern, Decreased stride length, Knee flexed in stance - right, Knee flexed in stance - left       General Gait Details: knees and hips flexed throughout, cues for posture, walker proximity  Stairs Stairs:  (declined steps, did simulate stepping in with RW to threshold and educated spouse how to assist on steps if accessing second floor of the home and he stated that is how they were performing previously; offerred handout though pt declined)          Wheelchair Mobility     Tilt Bed    Modified Rankin (Stroke Patients Only)       Balance Overall balance assessment: Needs assistance   Sitting balance-Leahy Scale: Good       Standing balance-Leahy Scale: Fair Standing balance comment: statically at the sink                             Pertinent Vitals/Pain Pain Assessment Pain Assessment: Faces Pain Location: back Pain Descriptors / Indicators: Discomfort, Grimacing, Guarding Pain Intervention(s): Monitored during session, Repositioned    Home Living Family/patient expects to be discharged to:: Private residence Living Arrangements: Spouse/significant other Available Help at Discharge: Family;Available 24 hours/day Type of Home: House Home Access:  Level entry       Home Layout: Able to live on main level with bedroom/bathroom Home Equipment: Shower seat - built in;Cane - single point      Prior Function Prior Level of Function : Needs assist             Mobility Comments: SPC, 1 recent fall ADLs Comments: husband assists with LB ADLs as needed      Extremity/Trunk Assessment   Upper Extremity Assessment Upper Extremity Assessment: Defer to OT evaluation    Lower Extremity Assessment Lower Extremity Assessment: RLE deficits/detail;LLE deficits/detail RLE Deficits / Details: limited hip flexion strength 2/5, knee extension at least 3+/5, ankle DF 3+/5; painful with tightness on back of legs and in thigh on front on R leg LLE Deficits / Details: limited hip flexion strength 2/5, knee extension at least 3+/5, ankle DF 3+/5; painful with tightness on back of legs and in thigh on front on R leg    Cervical / Trunk Assessment Cervical / Trunk Assessment: Back Surgery  Communication   Communication Communication: No apparent difficulties    Cognition Arousal: Alert Behavior During Therapy: WFL for tasks assessed/performed                             Following commands: Intact       Cueing       General Comments General comments (skin integrity, edema, etc.): completed toileting in bathroom on her own though educated in using toilet aide to extend reach with wet wipes if needed as pt asked if having difficulty how to perform    Exercises     Assessment/Plan    PT Assessment Patient does not need any further PT services  PT Problem List         PT Treatment Interventions      PT Goals (Current goals can be found in the Care Plan section)  Acute Rehab PT Goals PT Goal Formulation: All assessment and education complete, DC therapy    Frequency       Co-evaluation               AM-PAC PT 6 Clicks Mobility  Outcome Measure Help needed turning from your back to your side while in a flat bed without using bedrails?: A Little Help needed moving from lying on your back to sitting on the side of a flat bed without using bedrails?: A Little Help needed moving to and from a bed to a chair (including a wheelchair)?: A Little Help needed standing up from a chair using your arms (e.g., wheelchair or  bedside chair)?: A Little Help needed to walk in hospital room?: A Little Help needed climbing 3-5 steps with a railing? : A Little 6 Click Score: 18    End of Session   Activity Tolerance: Patient tolerated treatment well Patient left: in bed;with call bell/phone within reach;with family/visitor present   PT Visit Diagnosis: Difficulty in walking, not elsewhere classified (R26.2);Pain Pain - part of body:  (back)    Time: 0940-1007 PT Time Calculation (min) (ACUTE ONLY): 27 min   Charges:   PT Evaluation $PT Eval Low Complexity: 1 Low PT Treatments $Self Care/Home Management: 8-22 PT General Charges $$ ACUTE PT VISIT: 1 Visit         Micheline Portal, PT Acute Rehabilitation Services [phone] 11/24/2023   Montie Portal 11/24/2023, 11:04 AM

## 2023-11-24 NOTE — Evaluation (Signed)
 Occupational Therapy Evaluation Patient Details Name: Jean Scott MRN: [id number] DOB: 1950/12/05 Today's Date: 11/24/2023   History of Present Illness   Jean Scott is an 73 y.o. female who is s/p  lumbar laminectomy L4-5 11/19. She has PMH fibromyalgia, depression, GERD, hemorrhoids, hypothyroid, and arthritis.     Clinical Impressions Jean Scott was evaluated s/p the above admission list. She needs assist for LB ADLs intermittently and ambulated with SPC at baseline. Upon evaluation the pt was limited by pain, spinal precautions, compensatory techniques, chronic fatigue and poor activity tolerance. Overall she needed CGA for transfers and mobility with RW. Due to the deficits listed below the pt also needs min A for LB ADLs. Pt will benefit from continued acute OT services and no follow up OT.       Functional Status Assessment   Patient has had a recent decline in their functional status and demonstrates the ability to make significant improvements in function in a reasonable and predictable amount of time.     Equipment Recommendations   Other (comment) (RW)      Precautions/Restrictions   Precautions Precautions: Fall;Cervical Precaution Booklet Issued: Yes (comment) Recall of Precautions/Restrictions: Intact Restrictions Weight Bearing Restrictions Per Provider Order: No     Mobility Bed Mobility Overal bed mobility: Needs Assistance Bed Mobility: Rolling, Sidelying to Sit Rolling: Supervision Sidelying to sit: Supervision       General bed mobility comments: cues for log roll    Transfers Overall transfer level: Needs assistance Equipment used: Rolling walker (2 wheels) Transfers: Sit to/from Stand Sit to Stand: Contact guard assist           General transfer comment: cues for hand placement      Balance Overall balance assessment: Needs assistance Sitting-balance support: Feet supported Sitting balance-Leahy Scale: Good     Standing  balance support: During functional activity, No upper extremity supported Standing balance-Leahy Scale: Fair Standing balance comment: statically at the sink                           ADL either performed or assessed with clinical judgement   ADL Overall ADL's : Needs assistance/impaired Eating/Feeding: Independent   Grooming: Supervision/safety   Upper Body Bathing: Set up;Sitting   Lower Body Bathing: Minimal assistance;Sit to/from stand   Upper Body Dressing : Set up   Lower Body Dressing: Minimal assistance;Sit to/from stand   Toilet Transfer: Contact guard assist;Ambulation;Rolling walker (2 wheels);Regular Toilet   Toileting- Clothing Manipulation and Hygiene: Supervision/safety;Sitting/lateral lean       Functional mobility during ADLs: Contact guard assist;Rolling walker (2 wheels) General ADL Comments: cues for compensatory techniques, min A to maintain precautions during LB ADLs     Vision Baseline Vision/History: 0 No visual deficits Vision Assessment?: No apparent visual deficits     Perception Perception: Within Functional Limits       Praxis Praxis: WFL       Pertinent Vitals/Pain Pain Assessment Pain Assessment: Faces Pain Location: back Pain Descriptors / Indicators: Discomfort, Grimacing, Guarding Pain Intervention(s): Limited activity within patient's tolerance, Monitored during session     Extremity/Trunk Assessment Upper Extremity Assessment Upper Extremity Assessment: Generalized weakness   Lower Extremity Assessment Lower Extremity Assessment: Defer to PT evaluation   Cervical / Trunk Assessment Cervical / Trunk Assessment: Back Surgery   Communication Communication Communication: No apparent difficulties   Cognition Arousal: Alert Behavior During Therapy: WFL for tasks assessed/performed Cognition: No apparent impairments  Following commands: Intact        Cueing  General Comments      VSS On RA           Home Living Family/patient expects to be discharged to:: Private residence   Available Help at Discharge: Family;Available 24 hours/day Type of Home: House Home Access: Level entry     Home Layout: Able to live on main level with bedroom/bathroom     Bathroom Shower/Tub: Producer, Television/film/video: Handicapped height     Home Equipment: Shower seat - built in;Cane - single point          Prior Functioning/Environment Prior Level of Function : Needs assist             Mobility Comments: SPC, 1 recent fall ADLs Comments: husband assists with LB ADLs as needed    OT Problem List: Decreased strength;Decreased range of motion;Decreased activity tolerance;Impaired balance (sitting and/or standing);Decreased safety awareness;Decreased knowledge of use of DME or AE;Decreased knowledge of precautions;Pain   OT Treatment/Interventions: Self-care/ADL training;Therapeutic exercise;DME and/or AE instruction;Therapeutic activities;Patient/family education;Balance training      OT Goals(Current goals can be found in the care plan section)   Acute Rehab OT Goals Patient Stated Goal: home OT Goal Formulation: With patient Time For Goal Achievement: 12/08/23 Potential to Achieve Goals: Good   OT Frequency:  Min 2X/week       AM-PAC OT 6 Clicks Daily Activity     Outcome Measure Help from another person eating meals?: None Help from another person taking care of personal grooming?: A Little Help from another person toileting, which includes using toliet, bedpan, or urinal?: A Little Help from another person bathing (including washing, rinsing, drying)?: A Little Help from another person to put on and taking off regular upper body clothing?: A Little Help from another person to put on and taking off regular lower body clothing?: A Little 6 Click Score: 19   End of Session Equipment Utilized During Treatment:  Gait belt;Rolling walker (2 wheels) Nurse Communication: Mobility status  Activity Tolerance: Patient tolerated treatment well Patient left: in bed;with call bell/phone within reach;with family/visitor present  OT Visit Diagnosis: Unsteadiness on feet (R26.81);Other abnormalities of gait and mobility (R26.89);Muscle weakness (generalized) (M62.81);Pain;History of falling (Z91.81)                Time: 9194-9168 OT Time Calculation (min): 26 min Charges:  OT General Charges $OT Visit: 1 Visit OT Evaluation $OT Eval Moderate Complexity: 1 Mod OT Treatments $Self Care/Home Management : 8-22 mins  Lucie Kendall, OTR/L Acute Rehabilitation Services Office [phone] Secure Chat Communication Preferred   Lucie JONETTA Kendall 11/24/2023, 10:22 AM

## 2023-11-28 DIAGNOSIS — Z1212 Encounter for screening for malignant neoplasm of rectum: Secondary | ICD-10-CM | POA: Diagnosis not present

## 2023-12-12 DIAGNOSIS — R82998 Other abnormal findings in urine: Secondary | ICD-10-CM | POA: Diagnosis not present

## 2023-12-12 DIAGNOSIS — I10 Essential (primary) hypertension: Secondary | ICD-10-CM | POA: Diagnosis not present

## 2024-01-11 ENCOUNTER — Encounter: Payer: Self-pay | Admitting: Cardiovascular Disease

## 2024-01-12 ENCOUNTER — Telehealth: Payer: Self-pay | Admitting: Pharmacist Clinician (PhC)/ Clinical Pharmacy Specialist

## 2024-01-12 ENCOUNTER — Other Ambulatory Visit (HOSPITAL_COMMUNITY): Payer: Self-pay

## 2024-01-12 ENCOUNTER — Telehealth: Payer: Self-pay | Admitting: Pharmacy Technician

## 2024-01-12 NOTE — Telephone Encounter (Signed)
 Pharmacy Patient Advocate Encounter  Received notification from [facility] that Prior Authorization for repatha  has been APPROVED from 01/12/24 to 01/11/25   PA #/Case ID/Reference #: [id number]    Just filled 01/03/24

## 2024-01-12 NOTE — Telephone Encounter (Signed)
 Repatha  PA expired end of 2025, can you please renew (if it hasn't already been done)  Thanks!

## 2024-01-12 NOTE — Telephone Encounter (Signed)
" ° °  Pharmacy Patient Advocate Encounter   Received notification from Pt Calls Messages that prior authorization for repatha  is required/requested.   Insurance verification completed.   The patient is insured through [facility].   Per test claim: PA required; PA submitted to above mentioned insurance via Latent Key/confirmation #/EOC [id number] Status is pending  "

## 2024-01-27 ENCOUNTER — Other Ambulatory Visit: Payer: Self-pay | Admitting: Cardiovascular Disease

## 2024-01-27 DIAGNOSIS — I7121 Aneurysm of the ascending aorta, without rupture: Secondary | ICD-10-CM

## 2024-01-27 DIAGNOSIS — I712 Thoracic aortic aneurysm, without rupture, unspecified: Secondary | ICD-10-CM

## 2024-01-27 DIAGNOSIS — E782 Mixed hyperlipidemia: Secondary | ICD-10-CM

## 2024-01-30 ENCOUNTER — Other Ambulatory Visit: Payer: Self-pay

## 2024-01-30 ENCOUNTER — Other Ambulatory Visit (HOSPITAL_COMMUNITY): Payer: Self-pay

## 2024-01-30 MED ORDER — REPATHA SURECLICK 140 MG/ML ~~LOC~~ SOAJ
140.0000 mg | SUBCUTANEOUS | 3 refills | Status: AC
  Filled 2024-01-30: qty 6, 84d supply, fill #0

## 2024-02-01 ENCOUNTER — Other Ambulatory Visit (HOSPITAL_COMMUNITY): Payer: Self-pay

## 2024-06-19 ENCOUNTER — Other Ambulatory Visit (HOSPITAL_BASED_OUTPATIENT_CLINIC_OR_DEPARTMENT_OTHER)

## 2024-06-27 ENCOUNTER — Ambulatory Visit: Admitting: Cardiovascular Disease
